# Patient Record
Sex: Female | Born: 1980 | Race: Black or African American | Hispanic: No | Marital: Single | Smoking: Never smoker
Health system: Southern US, Community
[De-identification: ages and names within clinical notes are randomized; demographics above are authoritative.]

## PROBLEM LIST (undated history)

## (undated) ENCOUNTER — Inpatient Hospital Stay (HOSPITAL_COMMUNITY): Payer: Self-pay

## (undated) ENCOUNTER — Ambulatory Visit: Payer: Managed Care, Other (non HMO) | Source: Home / Self Care

## (undated) DIAGNOSIS — N764 Abscess of vulva: Secondary | ICD-10-CM

---
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
## 2004-09-01 ENCOUNTER — Emergency Department (HOSPITAL_COMMUNITY): Admission: EM | Admit: 2004-09-01 | Discharge: 2004-09-01 | Payer: Self-pay | Admitting: Family Medicine

## 2005-03-31 ENCOUNTER — Emergency Department (HOSPITAL_COMMUNITY): Admission: EM | Admit: 2005-03-31 | Discharge: 2005-03-31 | Payer: Self-pay | Admitting: Emergency Medicine

## 2005-06-08 ENCOUNTER — Ambulatory Visit (HOSPITAL_COMMUNITY): Admission: RE | Admit: 2005-06-08 | Discharge: 2005-06-08 | Payer: Self-pay | Admitting: Family Medicine

## 2005-08-07 ENCOUNTER — Emergency Department (HOSPITAL_COMMUNITY): Admission: EM | Admit: 2005-08-07 | Discharge: 2005-08-07 | Payer: Self-pay | Admitting: Family Medicine

## 2005-09-08 ENCOUNTER — Ambulatory Visit (HOSPITAL_COMMUNITY): Admission: AD | Admit: 2005-09-08 | Discharge: 2005-09-08 | Payer: Self-pay | Admitting: Gynecology

## 2006-01-04 ENCOUNTER — Other Ambulatory Visit: Admission: RE | Admit: 2006-01-04 | Discharge: 2006-01-04 | Payer: Self-pay | Admitting: Obstetrics and Gynecology

## 2006-03-19 ENCOUNTER — Inpatient Hospital Stay (HOSPITAL_COMMUNITY): Admission: AD | Admit: 2006-03-19 | Discharge: 2006-03-22 | Payer: Self-pay | Admitting: Obstetrics and Gynecology

## 2006-04-30 ENCOUNTER — Emergency Department (HOSPITAL_COMMUNITY): Admission: EM | Admit: 2006-04-30 | Discharge: 2006-04-30 | Payer: Self-pay | Admitting: Family Medicine

## 2008-11-05 ENCOUNTER — Emergency Department (HOSPITAL_COMMUNITY): Admission: EM | Admit: 2008-11-05 | Discharge: 2008-11-05 | Payer: Self-pay | Admitting: Family Medicine

## 2009-02-25 ENCOUNTER — Emergency Department (HOSPITAL_COMMUNITY): Admission: EM | Admit: 2009-02-25 | Discharge: 2009-02-25 | Payer: Self-pay | Admitting: Emergency Medicine

## 2010-08-07 NOTE — H&P (Signed)
NAME:  Elizabeth Donaldson, Elizabeth Donaldson NO.:  [id number]   MEDICAL RECORD NO.:  [id number]          PATIENT TYPE:  OUT   LOCATION:  [facility]                          FACILITY:  [facility]   PHYSICIAN:  Hal Morales, M.D.DATE OF BIRTH:  1980-07-20   DATE OF ADMISSION:  03/20/2006  DATE OF DISCHARGE:                              HISTORY & PHYSICAL   HISTORY OF PRESENT ILLNESS:  This is a 30 year old gravida 1, para 0 at  57 and 3/7 weeks, who presents with contractions every 2 minutes. She  denies leaking or bleeding.  Reports positive fetal movement.  The  pregnancy has been followed by Dr. Pennie Rushing and remarkable for:  1. Increased BMI.  2. ASCUS Pap.  3. First trimester trichomonas.  4. History of smoking.  5. Group B strep positive.   ALLERGIES:  NONE.   OBSTETRICAL HISTORY:  The patient is primigravida.   MEDICAL HISTORY:  Remarkable for:  1. Trichomonas in June 2007, which was treated.  2. Childhood varicella.   SURGICAL HISTORY:  Negative.   FAMILY HISTORY:  Remarkable for mother, father and grandmother with  hypertension.  Mother and grandfather with anemia.  Grandfather, aunt  and uncle with diabetes.  Cousin with Grave's disease.  Uncle with renal  failure; and a grandfather with kidney stones.   GENETIC HISTORY:  Negative.   SOCIAL HISTORY:  The patient is single.  Father of the baby, Denver Faster, is  questionably supportive (not currently with her tonight).  Her mother is  with her tonight.  She is of the [employer].  She denies any  alcohol, tobacco or drug use.   PRENATAL LABS:  Hemoglobin 13.5, platelets 442.  Blood type O positive.  Antibody screen negative.  RPR Nonreactive.  Rubella immune.  Hepatitis  negative.  HIV declined.  Pap test ASCUS.  Gonorrhea and chlamydia both  negative.  Hemoglobin electrophoresis normal. Varicella immune.   HISTORY OF CURRENT PREGNANCY:  The patient [facility]red[facility] She had been  transferred to [facility] at 16 weeks.  She had an ultrasound at 19 weeks, which  was normal.  She had a Glucola at 28 weeks, which was 80.  She had an  ultrasound at 34 weeks, showing 85% percentile growth and bilateral  hydroceles in the baby.  She was positive group B strep at term.  The patient developed a folliculitis in the right upper thigh with an  abscess, which was recently drained and packed and is healing.   PHYSICAL EXAMINATION:  VITAL SIGNS:  Stable, afebrile.  HEENT:  Within normal limits.  NECK:  Thyroid normal, not enlarged.  ABDOMEN:  Gravid at 38 cm.  Vertex to Leopold's.  FETAL MONITORING:  Shows reactive fetal heart rate with uterine  contractions every 2 minutes.  PELVIC:  Cervix was initially 2-3 cm and is now 4-4+ cm; 85% effaced,  minus 2 station and vertex presentation.  EXTREMITIES:  Within normal limits.   ASSESSMENT:  1. Intrauterine pregnancy at 9 and 3/7 weeks.  2. Active labor.  3. Group B  strep positive.  4. Right thigh abscess.   PLAN:  1. Admit to birthing suite, per Dr. Pennie Rushing.  2. Routine M.D. orders.  3. Penicillin prophylaxis.      Marie L. Williams, C.N.M.      Hal Morales, M.D.  Electronically Signed    MLW/MEDQ  D:  03/20/2006  T:  03/20/2006  Job:  [id number]

## 2012-04-24 ENCOUNTER — Encounter (HOSPITAL_COMMUNITY): Payer: Self-pay

## 2012-04-24 ENCOUNTER — Emergency Department (HOSPITAL_COMMUNITY)
Admission: EM | Admit: 2012-04-24 | Discharge: 2012-04-24 | Disposition: A | Discharge status: Home / Self Care | Payer: BC Managed Care – PPO | Source: Home / Self Care

## 2012-04-24 DIAGNOSIS — B349 Viral infection, unspecified: Secondary | ICD-10-CM

## 2012-04-24 DIAGNOSIS — B9789 Other viral agents as the cause of diseases classified elsewhere: Secondary | ICD-10-CM

## 2012-04-24 NOTE — ED Provider Notes (Signed)
Medical screening examination/treatment/procedure(s) were performed by non-physician practitioner and as supervising physician I was immediately available for consultation/collaboration.  Leslee Home, M.D.   Reuben Likes, MD 04/24/12 2226

## 2012-04-24 NOTE — ED Provider Notes (Signed)
History     CSN: [id number]  Arrival date & time 04/24/12  1128   First MD Initiated Contact with Patient 04/24/12 1217      No chief complaint on file.   (Consider location/radiation/quality/duration/timing/severity/associated sxs/prior treatment) HPI Comments: 32 year old female who states she developed a feeling of cold alternating with hot yesterday. It is associated with mild body aches. She denies fever, chills, headache, upper respiratory congestion, earache, sore throat, cough, shortness of breath, chest pain, abdominal pain, nausea, vomiting or urinary symptoms. She denies any source of pain or bleeding other than bodyaches.   No past medical history on file.  No past surgical history on file.  No family history on file.  History  Substance Use Topics  . Smoking status: Not on file  . Smokeless tobacco: Not on file  . Alcohol Use: Not on file    OB History    No data available      Review of Systems  Constitutional: Positive for activity change. Negative for fever, chills, appetite change and fatigue.  HENT: Negative for congestion, sore throat, facial swelling, rhinorrhea, trouble swallowing, neck pain, neck stiffness, voice change and postnasal drip.   Eyes: Negative.   Respiratory: Negative.   Cardiovascular: Negative.   Gastrointestinal: Negative.   Genitourinary: Negative.   Musculoskeletal: Positive for myalgias.  Skin: Negative for pallor and rash.  Neurological: Negative.   Hematological: Negative.   Psychiatric/Behavioral: Negative.     Allergies  Review of patient's allergies indicates not on file.  Home Medications  No current outpatient prescriptions on file.  BP 125/80  Pulse 84  Temp 99.1 F (37.3 C) (Oral)  Resp 20  SpO2 98%  Physical Exam  Nursing note and vitals reviewed. Constitutional: She is oriented to person, place, and time. She appears well-developed and well-nourished. No distress.  HENT:  Nose: Nose normal.   Mouth/Throat: Oropharynx is clear and moist. No oropharyngeal exudate.       Bilateral TMs are normal.  Eyes: Conjunctivae normal and EOM are normal. Pupils are equal, round, and reactive to light.  Neck: Normal range of motion. Neck supple.  Cardiovascular: Normal rate, regular rhythm and normal heart sounds.   No murmur heard. Pulmonary/Chest: Effort normal and breath sounds normal. No respiratory distress. She has no wheezes. She has no rales.  Abdominal: Soft. There is no tenderness.  Musculoskeletal: Normal range of motion. She exhibits no edema.  Lymphadenopathy:    She has no cervical adenopathy.  Neurological: She is alert and oriented to person, place, and time.  Skin: Skin is warm and dry. No rash noted.  Psychiatric: She has a normal mood and affect.    ED Course  Procedures (including critical care time)  Labs Reviewed - No data to display No results found.   1. Nonspecific syndrome suggestive of viral illness       MDM  Rest for the next 2 or 3 days as needed. Drink plenty of fluids stay well hydrated For aches may take Tylenol every 4 hours or ibuprofen every 6 hours as needed. Instructions with red flags to watch for , Seek medical attention for these.  For any worsening new symptoms or problems may return.        Hayden Rasmussen, NP 04/24/12 [phone]

## 2012-04-24 NOTE — ED Notes (Signed)
Seen by NP only.

## 2012-07-16 ENCOUNTER — Emergency Department (INDEPENDENT_AMBULATORY_CARE_PROVIDER_SITE_OTHER): Payer: BC Managed Care – PPO

## 2012-07-16 ENCOUNTER — Encounter (HOSPITAL_COMMUNITY): Payer: Self-pay | Admitting: *Deleted

## 2012-07-16 ENCOUNTER — Emergency Department (HOSPITAL_COMMUNITY)
Admission: EM | Admit: 2012-07-16 | Discharge: 2012-07-16 | Disposition: A | Discharge status: Home / Self Care | Payer: BC Managed Care – PPO | Source: Home / Self Care | Attending: Emergency Medicine | Admitting: Emergency Medicine

## 2012-07-16 DIAGNOSIS — S93409A Sprain of unspecified ligament of unspecified ankle, initial encounter: Secondary | ICD-10-CM

## 2012-07-16 DIAGNOSIS — S93401A Sprain of unspecified ligament of right ankle, initial encounter: Secondary | ICD-10-CM

## 2012-07-16 MED ORDER — ACETAMINOPHEN 325 MG PO TABS: 650.0000 mg | ORAL_TABLET | Freq: Once | ORAL | Status: DC

## 2012-07-16 MED ORDER — HYDROCODONE-ACETAMINOPHEN 5-325 MG PO TABS
ORAL_TABLET | ORAL | Status: DC
Start: 2012-07-16 — End: 2012-08-20

## 2012-07-16 NOTE — ED Notes (Signed)
Walking in high heels last night.  Twisted R ankle and fell.  Hard bear wt. on it today.  Swelling medially and laterally.  R ppp.

## 2012-07-16 NOTE — ED Provider Notes (Signed)
Chief Complaint:   Chief Complaint  Patient presents with  . Fall  . Ankle Pain    History of Present Illness:   Elizabeth Donaldson is a 32 year old female who injured her right ankle while walking in high heels last night. She twisted her ankle, did not hear a pop, but noted pain over the medial malleolus and some swelling. At first she was able to walk on it, but now cannot bear weight well. There is some swelling, no bruising, and no numbness or tingling.  Review of Systems:  Other than noted above, the patient denies any of the following symptoms: Systemic:  No fevers, chills, sweats, or aches.   Musculoskeletal:  No joint pain, arthritis, bursitis, swelling, back pain, or neck pain. Neurological:  No muscular weakness or paresthesias.  PMFSH:  Past medical history, family history, social history, meds, and allergies were reviewed.   Physical Exam:   Vital signs:  BP 134/82  Pulse 76  Temp(Src) 99.1 F (37.3 C) (Oral)  Resp 16  SpO2 100%  LMP 06/20/2012 Gen:  Alert and oriented times 3.  In no distress. Musculoskeletal: Exam of the ankle reveals slight swelling beneath the medial malleolus and tenderness to palpation. There's not much tenderness or swelling below the lateral malleolus. The ankle has a limited range of motion with pain on movement. Anterior drawer sign was negative.  Talar tilt negative. Squeeze test negative. Achilles tendon, peroneal tendon, and tibialis posterior were intact. Otherwise, all joints had a full a ROM with no swelling, bruising or deformity.  No edema, pulses full. Extremities were warm and pink.  Capillary refill was brisk.  Skin:  Clear, warm and dry.  No rash. Neuro:  Alert and oriented times 3.  Muscle strength was normal.  Sensation was intact to light touch.   Radiology:  Dg Ankle Complete Right  07/16/2012  *RADIOLOGY REPORT*  Clinical Data: Fall  RIGHT ANKLE - COMPLETE 3+ VIEW  Comparison: All  Findings: Normal alignment and no fracture.  Ankle  joint effusion is present.  Ankle mortise is intact.  IMPRESSION: Ankle effusion.  Negative for fracture.   Original Report Authenticated By: Janeece Riggers, M.D.    I reviewed the images independently and personally and concur with the radiologist's findings.  Course in Urgent Care Center:   She was given an ASO brace and crutches for ambulation. Also for the pain she was given ibuprofen since she is going to be driving home.  Assessment:  The encounter diagnosis was Ankle sprain, right, initial encounter.  Plan:   1.  The following meds were prescribed:   Discharge Medication List as of 07/16/2012  4:06 PM    START taking these medications   Details  HYDROcodone-acetaminophen (NORCO/VICODIN) 5-325 MG per tablet 1 to 2 tabs every 4 to 6 hours as needed for pain., Print       2.  The patient was instructed in symptomatic care, including rest and activity, elevation, application of ice and compression.  Appropriate handouts were given. 3.  The patient was told to return if becoming worse in any way, if no better in 3 or 4 days, and given some red flag symptoms such as worsening pain or neurological symptoms that would indicate earlier return.   4.  The patient was told to follow up with Dr. Ranell Patrick if no better in 2 weeks.    Reuben Likes, MD 07/16/12 [phone]

## 2012-08-12 ENCOUNTER — Emergency Department (HOSPITAL_COMMUNITY)
Admission: EM | Admit: 2012-08-12 | Discharge: 2012-08-12 | Disposition: A | Discharge status: Home / Self Care | Payer: BC Managed Care – PPO | Source: Home / Self Care | Attending: Emergency Medicine | Admitting: Emergency Medicine

## 2012-08-12 ENCOUNTER — Other Ambulatory Visit (HOSPITAL_COMMUNITY)
Admission: RE | Admit: 2012-08-12 | Discharge: 2012-08-12 | Disposition: A | Discharge status: Home / Self Care | Payer: BC Managed Care – PPO | Source: Ambulatory Visit | Attending: Emergency Medicine | Admitting: Emergency Medicine

## 2012-08-12 ENCOUNTER — Encounter (HOSPITAL_COMMUNITY): Payer: Self-pay | Admitting: Emergency Medicine

## 2012-08-12 DIAGNOSIS — S93409A Sprain of unspecified ligament of unspecified ankle, initial encounter: Secondary | ICD-10-CM

## 2012-08-12 DIAGNOSIS — Z3201 Encounter for pregnancy test, result positive: Secondary | ICD-10-CM

## 2012-08-12 DIAGNOSIS — Z3481 Encounter for supervision of other normal pregnancy, first trimester: Secondary | ICD-10-CM

## 2012-08-12 DIAGNOSIS — Z113 Encounter for screening for infections with a predominantly sexual mode of transmission: Secondary | ICD-10-CM | POA: Insufficient documentation

## 2012-08-12 DIAGNOSIS — N760 Acute vaginitis: Secondary | ICD-10-CM | POA: Insufficient documentation

## 2012-08-12 LAB — POCT PREGNANCY, URINE: Preg Test, Ur: POSITIVE — AB

## 2012-08-12 MED ORDER — PRENATAL VITAMINS (DIS) PO TABS: ORAL_TABLET | ORAL | Status: DC

## 2012-08-12 NOTE — ED Notes (Signed)
Pt c/o spotting episode on Sunday notice with wiping. Pt state positive pregnancy test today.  Pt denies pelvic or abdominal pain. Was pregnancy confirmation.

## 2012-08-12 NOTE — ED Provider Notes (Signed)
Chief Complaint:   Chief Complaint  Patient presents with  . Possible Pregnancy    positive home preg. test today. one spotting episode last sunday. denies pelvic or abdomial pain    History of Present Illness:   Elizabeth Donaldson is a 32 year old female who comes in today for confirmation of the diagnosis of pregnancy. Her last normal menstrual period was about April 27. She had a tiny spot of blood that she passed this past Sunday, week ago. This was not accompanied by pain or cramping. She's had no further bleeding, spotting, pain, or cramping since then. She took a pregnancy test today and it was positive. She is a gravida 2 para 1 aborta 0. Her child is 35 years old. She had no problems with her previous pregnancy. She denies any history of diabetes or high blood pressure. She's a little bit nauseated but not having vomiting. No breast swelling or tenderness. She's otherwise in good health and does not smoke or drink.  Review of Systems:  Other than noted above, the patient denies any of the following symptoms: Systemic:  No fever, chills, sweats, or weight loss. GI:  No abdominal pain, nausea, anorexia, vomiting, diarrhea, constipation, melena or hematochezia. GU:  No dysuria, frequency, urgency, hematuria, vaginal discharge, itching, or abnormal vaginal bleeding. Skin:  No rash or itching.  PMFSH:  Past medical history, family history, social history, meds, and allergies were reviewed.    Physical Exam:   Vital signs:  BP 138/72  Pulse 87  Temp(Src) 98.2 F (36.8 C) (Oral)  Resp 16  SpO2 100%  LMP 07/16/2012 General:  Alert, oriented and in no distress. Lungs:  Breath sounds clear and equal bilaterally.  No wheezes, rales or rhonchi. Heart:  Regular rhythm.  No gallops or murmers. Abdomen:  Soft, flat and non-distended.  No organomegaly or mass.  No tenderness, guarding or rebound.  Bowel sounds normally active. Pelvic exam:  Normal external genitalia, vaginal and cervical mucosa  were normal. There was a small amount of white discharge. There was no bleeding or spotting. No pain on cervical motion. Uterus was normal in size and shape and nontender. No adnexal masses or tenderness. Skin:  Clear, warm and dry.  Labs:   Results for orders placed during the hospital encounter of 08/12/12  POCT PREGNANCY, URINE      Result Value Range   Preg Test, Ur POSITIVE (*) NEGATIVE    DNA probe for gonorrhea, Chlamydia, Gardnerella, Trichomonas, and candida obtained as well as a quantitative hCG.  Assessment:  The encounter diagnosis was Pregnancy, subsequent, first trimester.  There is no evidence for tubal ectopic pregnancy. I think the small amount of blood that she saw may of just been due to implantation. I told her she does see any more bleeding or have any pain to go directly to [facility] hospital for an ultrasound. She is happy to be pregnant, and I gave her a prescription for prenatal vitamins. Since she did have some minor bleeding, this technically would be considered a threatened abortion, so I suggested she followup at [facility] next week. A baseline quantitative hCG was obtained, with the thought that they will want to obtain another one when she goes there.  Plan:   1.  The following meds were prescribed:   Discharge Medication List as of 08/12/2012  5:52 PM    START taking these medications   Details  Prenatal Vitamins (DIS) TABS 1 daily, Normal       2.  The patient was instructed in symptomatic care and handouts were given. 3.  The patient was told to return if becoming worse in any way, if no better in 3 or 4 days, and given some red flag symptoms such as bleeding or pain that would indicate earlier return. 4.  Follow up at [facility] next week.    Reuben Likes, MD 08/12/12 [phone]

## 2012-08-16 NOTE — ED Notes (Signed)
GC/Chlamydia neg., Affirm: Gardnerella pos., Candida and Trich neg., BHCG 15,051 H.  Pt. adequately treated with Flagyl. Fowarded to Dr. Lorenz Coaster.  Vassie Moselle 08/16/2012

## 2012-08-17 ENCOUNTER — Telehealth (HOSPITAL_COMMUNITY): Payer: Self-pay | Admitting: *Deleted

## 2012-08-17 ENCOUNTER — Telehealth (HOSPITAL_COMMUNITY): Payer: Self-pay | Admitting: Emergency Medicine

## 2012-08-17 MED ORDER — METRONIDAZOLE 500 MG PO TABS: 500.0000 mg | ORAL_TABLET | Freq: Two times a day (BID) | ORAL | Status: DC

## 2012-08-17 NOTE — ED Notes (Signed)
The patient's DNA probe came back positive for Gardnerella. This was not treated. We'll send in a prescription to her pharmacy for metronidazole since she is pregnant.  Reuben Likes, MD 08/17/12 [phone]

## 2012-08-17 NOTE — ED Notes (Signed)
I called pt. Pt. verified x 2 and given results.  Pt. told she needs Flagyl for bacterial vaginosis.   Pt. instructed to no alcohol while taking this medication.  Pt. told she can pick up Rx. At [facility] on [address]. Pt. voiced understanding.  Pt. has appt. With OB-GYN tomorrow. Instructed pt. To tell her doctor. Pt. voiced understanding. Vassie Moselle 08/17/2012

## 2012-08-17 NOTE — Telephone Encounter (Signed)
Message copied by Reuben Likes on Thu Aug 17, 2012  9:16 AM ------      Message from: Vassie Moselle      Created: Wed Aug 16, 2012  2:59 PM      Regarding: labs       Documented.      ----- Message -----         From: Lab In [facility] Interface         Sent: 08/12/2012   6:45 PM           To: [facility] Follow Up                   ------

## 2012-08-20 ENCOUNTER — Encounter (HOSPITAL_COMMUNITY): Payer: Self-pay | Admitting: *Deleted

## 2012-08-20 ENCOUNTER — Inpatient Hospital Stay (HOSPITAL_COMMUNITY)
Admission: AD | Admit: 2012-08-20 | Discharge: 2012-08-20 | Disposition: A | Discharge status: Home / Self Care | Payer: BC Managed Care – PPO | Source: Ambulatory Visit | Attending: Obstetrics and Gynecology | Admitting: Obstetrics and Gynecology

## 2012-08-20 ENCOUNTER — Inpatient Hospital Stay (HOSPITAL_COMMUNITY): Payer: BC Managed Care – PPO

## 2012-08-20 DIAGNOSIS — O26859 Spotting complicating pregnancy, unspecified trimester: Secondary | ICD-10-CM

## 2012-08-20 LAB — CBC
HCT: 38.5 % (ref 36.0–46.0)
Hemoglobin: 13.1 g/dL (ref 12.0–15.0)
MCH: 29.6 pg (ref 26.0–34.0)
MCV: 86.9 fL (ref 78.0–100.0)
RBC: 4.43 MIL/uL (ref 3.87–5.11)
WBC: 9.2 10*3/uL (ref 4.0–10.5)

## 2012-08-20 LAB — HCG, QUANTITATIVE, PREGNANCY: hCG, Beta Chain, Quant, S: 53440 m[IU]/mL — ABNORMAL HIGH (ref <5)

## 2012-08-20 NOTE — MAU Provider Note (Signed)
History     CSN: [id number]  Arrival date and time: 08/20/12 1507   First Provider Initiated Contact with Patient 08/20/12 1605      Chief Complaint  Patient presents with  . Vaginal Bleeding   HPI 32 y.o. G2P1001 at [redacted]w[redacted]d with spotting x 2 today, a few episodes of spotting prior to this. No pain. Recently treated for BV.   History reviewed. No pertinent past medical history.  History reviewed. No pertinent past surgical history.  Family History  Problem Relation Age of Onset  . Hypertension Mother   . Hypertension Father     History  Substance Use Topics  . Smoking status: Never Smoker   . Smokeless tobacco: Not on file  . Alcohol Use: No    Allergies: No Known Allergies  Prescriptions prior to admission  Medication Sig Dispense Refill  . HYDROcodone-acetaminophen (NORCO/VICODIN) 5-325 MG per tablet 1 to 2 tabs every 4 to 6 hours as needed for pain.  20 tablet  0  . ibuprofen (ADVIL,MOTRIN) 200 MG tablet Take 200 mg by mouth every 6 (six) hours as needed for pain.      . metroNIDAZOLE (FLAGYL) 500 MG tablet Take 1 tablet (500 mg total) by mouth 2 (two) times daily.  14 tablet  0  . Prenatal Vitamins (DIS) TABS 1 daily  30 tablet  12    Review of Systems  Constitutional: Negative.   Respiratory: Negative.   Cardiovascular: Negative.   Gastrointestinal: Negative for nausea, vomiting, abdominal pain, diarrhea and constipation.  Genitourinary: Negative for dysuria, urgency, frequency, hematuria and flank pain.       + spotting   Musculoskeletal: Negative.   Neurological: Negative.   Psychiatric/Behavioral: Negative.    Physical Exam   Blood pressure 133/61, pulse 95, temperature 97.4 F (36.3 C), temperature source Oral, resp. rate 18, height 5' 0.5" (1.537 m), weight 202 lb (91.627 kg), last menstrual period 07/16/2012.  Physical Exam  Nursing note and vitals reviewed. Constitutional: She is oriented to person, place, and time. She appears well-developed and  well-nourished. No distress.  Cardiovascular: Normal rate.   Respiratory: Effort normal.  GI: Soft. There is no tenderness.  Musculoskeletal: Normal range of motion.  Neurological: She is alert and oriented to person, place, and time.  Skin: Skin is warm and dry.  Psychiatric: She has a normal mood and affect.    MAU Course  Procedures Results for orders placed during the hospital encounter of 08/20/12 (from the past 72 hour(s))  HCG, QUANTITATIVE, PREGNANCY     Status: Abnormal   Collection Time    08/20/12  3:34 PM      Result Value Range   hCG, Beta Chain, Quant, S 53440 (*) <5 mIU/mL   Comment:              GEST. AGE      CONC.  (mIU/mL)       <=1 WEEK        5 - 50         2 WEEKS       50 - 500         3 WEEKS       100 - 10,000         4 WEEKS     1,000 - 30,000         5 WEEKS     3,500 - 115,000       6-8 WEEKS     12,000 - 270,000  12 WEEKS     15,000 - 220,000                FEMALE AND NON-PREGNANT FEMALE:         LESS THAN 5 mIU/mL  CBC     Status: Abnormal   Collection Time    08/20/12  3:34 PM      Result Value Range   WBC 9.2  4.0 - 10.5 K/uL   RBC 4.43  3.87 - 5.11 MIL/uL   Hemoglobin 13.1  12.0 - 15.0 g/dL   HCT 41.3  24.4 - 01.0 %   MCV 86.9  78.0 - 100.0 fL   MCH 29.6  26.0 - 34.0 pg   MCHC 34.0  30.0 - 36.0 g/dL   RDW 27.2  53.6 - 64.4 %   Platelets 468 (*) 150 - 400 K/uL  ABO/RH     Status: None   Collection Time    08/20/12  3:34 PM      Result Value Range   ABO/RH(D) O POS     U/S: 7.4 week IUP, + FHR, small SCH  Assessment and Plan   1. Spotting in early pregnancy   Precautions rev'd, keep next appointment as scheduled    Medication List    TAKE these medications       metroNIDAZOLE 500 MG tablet  Commonly known as:  FLAGYL  Take 1 tablet (500 mg total) by mouth 2 (two) times daily.     Prenatal Vitamins (DIS) Tabs  1 daily            Follow-up Information   Follow up with [facility]. (as  scheduled)    Contact information:   [address] [place] [address] [phone]        Georges Mouse 08/20/2012, 4:05 PM

## 2012-08-20 NOTE — MAU Note (Addendum)
preg confirmed at Urgent Care. Was at [facility] on Fri..  Saw a spot of blood earlier when went to bathroom today.  Threw up when brushing teeth.

## 2012-08-20 NOTE — MAU Note (Signed)
Pt reports vomiting today one time. Then she noticed some spotting twice. Pt still feels nauseated, but denies need for nausea meds at this time. Pt denies pain.

## 2012-09-05 ENCOUNTER — Encounter (HOSPITAL_COMMUNITY): Payer: Self-pay | Admitting: *Deleted

## 2012-09-05 ENCOUNTER — Inpatient Hospital Stay (HOSPITAL_COMMUNITY)
Admission: AD | Admit: 2012-09-05 | Discharge: 2012-09-05 | Disposition: A | Discharge status: Home / Self Care | Payer: BC Managed Care – PPO | Source: Ambulatory Visit | Attending: Family Medicine | Admitting: Family Medicine

## 2012-09-05 DIAGNOSIS — O99891 Other specified diseases and conditions complicating pregnancy: Secondary | ICD-10-CM | POA: Insufficient documentation

## 2012-09-05 DIAGNOSIS — R109 Unspecified abdominal pain: Secondary | ICD-10-CM | POA: Insufficient documentation

## 2012-09-05 DIAGNOSIS — O26891 Other specified pregnancy related conditions, first trimester: Secondary | ICD-10-CM

## 2012-09-05 DIAGNOSIS — O9989 Other specified diseases and conditions complicating pregnancy, childbirth and the puerperium: Secondary | ICD-10-CM

## 2012-09-05 DIAGNOSIS — M549 Dorsalgia, unspecified: Secondary | ICD-10-CM | POA: Insufficient documentation

## 2012-09-05 LAB — URINALYSIS, ROUTINE W REFLEX MICROSCOPIC
Bilirubin Urine: NEGATIVE
Specific Gravity, Urine: 1.025 (ref 1.005–1.030)
Urobilinogen, UA: 0.2 mg/dL (ref 0.0–1.0)

## 2012-09-05 MED ORDER — CYCLOBENZAPRINE HCL 10 MG PO TABS: 10.0000 mg | ORAL_TABLET | Freq: Three times a day (TID) | ORAL | Status: DC | PRN

## 2012-09-05 NOTE — MAU Note (Signed)
Patient states she started having abdominal pain yesterday. Denies bleeding or discharge. Has her first OB appointment with [facility] on 6-30.

## 2012-09-05 NOTE — MAU Provider Note (Signed)
History     CSN: [id number]  Arrival date and time: 09/05/12 1315   First Provider Initiated Contact with Patient 09/05/12 1401      Chief Complaint  Patient presents with  . Abdominal Pain   HPI Patient endorses 9/10 left flank pain while lying down and with certain activities since yesterday. She describes it as aching in character, non-radiating, and is relieved by rest or sitting still. She has not engaged in any strenuous or exacerbating activity that she recalls. The pain is currently 0/10. She endorses nausea but equates this with her pregnancy. She denies diarrhea, fever, chills, painful urination, frequency, or urgency, vaginal bleeding or discharge.  OB History   Grav Para Term Preterm Abortions TAB SAB Ect Mult Living   2 1 1       1       History reviewed. No pertinent past medical history.  History reviewed. No pertinent past surgical history.  Family History  Problem Relation Age of Onset  . Hypertension Mother   . Hypertension Father     History  Substance Use Topics  . Smoking status: Never Smoker   . Smokeless tobacco: Not on file  . Alcohol Use: No    Allergies: No Known Allergies  Prescriptions prior to admission  Medication Sig Dispense Refill  . ondansetron (ZOFRAN) 8 MG tablet Take 8 mg by mouth every 8 (eight) hours as needed for nausea.      . Prenatal Vit-Fe Fumarate-FA (PRENATAL MULTIVITAMIN) TABS Take 1 tablet by mouth daily at 12 noon.        Review of Systems  Constitutional: Negative for fever, chills and weight loss.  Respiratory: Negative for cough.   Cardiovascular: Negative for chest pain and palpitations.  Gastrointestinal: Positive for nausea. Negative for vomiting, abdominal pain, diarrhea, constipation and blood in stool.  Genitourinary: Positive for flank pain (left lower flank). Negative for dysuria, urgency, frequency and hematuria.  Musculoskeletal: Negative for myalgias.  Neurological: Negative for dizziness and  headaches.   Physical Exam   Blood pressure 129/59, pulse 96, temperature 97 F (36.1 C), resp. rate 18, height 5' 0.5" (1.537 m), weight 93.078 kg (205 lb 3.2 oz), last menstrual period 07/16/2012, SpO2 100.00%.  Physical Exam  Constitutional: She is oriented to person, place, and time. She appears well-developed and well-nourished.  HENT:  Head: Normocephalic and atraumatic.  Neck: Normal range of motion.  Cardiovascular: Normal rate, regular rhythm, normal heart sounds and intact distal pulses.  Exam reveals no gallop and no friction rub.   No murmur heard. Respiratory: Effort normal and breath sounds normal.  GI: Soft. Bowel sounds are normal. She exhibits no distension and no mass (No hepatosplenomegaly). There is no tenderness. There is no rebound and no guarding.  Musculoskeletal: Normal range of motion.  Neurological: She is alert and oriented to person, place, and time.  Skin: Skin is warm and dry.  Psychiatric: She has a normal mood and affect. Her behavior is normal.    MAU Course  Procedures None   Assessment and Plan  A: 1. Back pain in pregnancy, first trimester   Appears MSK in origin  P: 1.    Medication List    TAKE these medications       cyclobenzaprine 10 MG tablet  Commonly known as:  FLEXERIL  Take 1 tablet (10 mg total) by mouth 3 (three) times daily as needed for muscle spasms.     ondansetron 8 MG tablet  Commonly known as:  ZOFRAN  Take 8 mg by mouth every 8 (eight) hours as needed for nausea.     prenatal multivitamin Tabs  Take 1 tablet by mouth daily at 12 noon.       Follow-up Information   Follow up with [facility]. (as scheduled)    Contact information:   [address] [place] [address] [phone]       Arther Abbott 09/05/2012, 2:08 PM

## 2012-09-05 NOTE — Progress Notes (Signed)
Written and verbal d/c instructions given and understanding voiced. 

## 2012-09-05 NOTE — MAU Note (Signed)
I've been having pain in my L side since yesterday. Worse with certain movements. Bothered me trying to sleep last night. Cramping and intermittent but esp noticeable with movement

## 2012-09-07 NOTE — MAU Provider Note (Signed)
Chart reviewed and agree with management and plan.  

## 2012-09-10 ENCOUNTER — Encounter (HOSPITAL_COMMUNITY): Payer: Self-pay | Admitting: Family

## 2012-09-10 ENCOUNTER — Inpatient Hospital Stay (HOSPITAL_COMMUNITY)
Admission: AD | Admit: 2012-09-10 | Discharge: 2012-09-10 | Disposition: A | Discharge status: Home / Self Care | Payer: BC Managed Care – PPO | Source: Ambulatory Visit | Attending: Obstetrics and Gynecology | Admitting: Obstetrics and Gynecology

## 2012-09-10 DIAGNOSIS — O2390 Unspecified genitourinary tract infection in pregnancy, unspecified trimester: Secondary | ICD-10-CM | POA: Insufficient documentation

## 2012-09-10 DIAGNOSIS — N751 Abscess of Bartholin's gland: Secondary | ICD-10-CM

## 2012-09-10 DIAGNOSIS — N764 Abscess of vulva: Secondary | ICD-10-CM | POA: Insufficient documentation

## 2012-09-10 MED ORDER — CEPHALEXIN 500 MG PO CAPS: 500.0000 mg | ORAL_CAPSULE | Freq: Four times a day (QID) | ORAL | Status: DC

## 2012-09-10 NOTE — MAU Note (Signed)
Patient presents to MAU with c/o boil or cyst on vagina since Friday. Denies drainage, fever, chills, or n/v. Reports she had bartholin cyst in last pregnancy.

## 2012-09-10 NOTE — MAU Note (Signed)
Pt c/o "boil" on her left labia. Noticed it on Friday. Had one with her last pregnancy.

## 2012-09-10 NOTE — MAU Provider Note (Signed)
  History     CSN: [id number]  Arrival date and time: 09/10/12 1129   First Provider Initiated Contact with Patient 09/10/12 1156      Chief Complaint  Patient presents with  . Cyst   HPI Ms. Elizabeth Donaldson is a 32 y.o. G2P1001 at [redacted]w[redacted]d who presents to MAU today with complaint of labial abscess. The patient states that the swelling started on Friday. She feels that it has increased slightly in size since then. It is tender to touch. She denies drainage, vaginal discharge, bleeding or fever. She has not tried hot compresses of sitz bath yet.   OB History   Grav Para Term Preterm Abortions TAB SAB Ect Mult Living   2 1 1       1       History reviewed. No pertinent past medical history.  History reviewed. No pertinent past surgical history.  Family History  Problem Relation Age of Onset  . Hypertension Mother   . Hypertension Father     History  Substance Use Topics  . Smoking status: Never Smoker   . Smokeless tobacco: Not on file  . Alcohol Use: No    Allergies: No Known Allergies  Prescriptions prior to admission  Medication Sig Dispense Refill  . cyclobenzaprine (FLEXERIL) 10 MG tablet Take 1 tablet (10 mg total) by mouth 3 (three) times daily as needed for muscle spasms.  30 tablet  0  . ondansetron (ZOFRAN) 8 MG tablet Take 8 mg by mouth every 8 (eight) hours as needed for nausea.      . Prenatal Vit-Fe Fumarate-FA (PRENATAL MULTIVITAMIN) TABS Take 1 tablet by mouth daily at 12 noon.        Review of Systems  Constitutional: Negative for fever.  Gastrointestinal: Negative for abdominal pain.  Genitourinary:       Neg - vaginal discharge, bleeding + labial swelling   Physical Exam   Blood pressure 136/65, pulse 101, temperature 98.7 F (37.1 C), temperature source Oral, resp. rate 18, height 5' 0.5" (1.537 m), weight 200 lb 9.6 oz (90.992 kg), last menstrual period 07/16/2012.  Physical Exam  Constitutional: She is oriented to person, place, and time.  She appears well-developed and well-nourished. No distress.  HENT:  Head: Normocephalic and atraumatic.  Cardiovascular: Normal rate, regular rhythm and normal heart sounds.   Respiratory: Effort normal and breath sounds normal. No respiratory distress.  GI: Soft. Bowel sounds are normal. She exhibits no distension and no mass. There is no tenderness. There is no rebound and no guarding.  Genitourinary: There is no tenderness on the right labia. There is tenderness (small amount of swelling noted at the base of the left labia without surrounding erythema; cool to touch) on the left labia.  Neurological: She is alert and oriented to person, place, and time.  Skin: Skin is warm and dry. No erythema.  Psychiatric: She has a normal mood and affect.    MAU Course  Procedures None  MDM Discussed patient with Dr. Ambrose Mantle. Prescribe Keflex and recommend sitz bath and warm compresses until drainage.   Assessment and Plan  A: Abscess  P: Discharge home Rx for Keflex sent to patient's pharmacy Patient advised to use warm compresses and sitz baths frequently until drainage Patient encouraged to keep follow-up in the office as scheduled Patient may return to MAU as needed or her condition were to change or worsen  Freddi Starr, PA-C  09/10/2012, 11:56 AM

## 2012-09-11 ENCOUNTER — Encounter (HOSPITAL_COMMUNITY): Payer: Self-pay | Admitting: *Deleted

## 2012-09-11 ENCOUNTER — Inpatient Hospital Stay (HOSPITAL_COMMUNITY)
Admission: AD | Admit: 2012-09-11 | Discharge: 2012-09-11 | Disposition: A | Discharge status: Home / Self Care | Payer: BC Managed Care – PPO | Source: Ambulatory Visit | Attending: Obstetrics and Gynecology | Admitting: Obstetrics and Gynecology

## 2012-09-11 DIAGNOSIS — O210 Mild hyperemesis gravidarum: Secondary | ICD-10-CM | POA: Insufficient documentation

## 2012-09-11 DIAGNOSIS — O219 Vomiting of pregnancy, unspecified: Secondary | ICD-10-CM

## 2012-09-11 DIAGNOSIS — O2390 Unspecified genitourinary tract infection in pregnancy, unspecified trimester: Secondary | ICD-10-CM | POA: Insufficient documentation

## 2012-09-11 DIAGNOSIS — N764 Abscess of vulva: Secondary | ICD-10-CM

## 2012-09-11 HISTORY — DX: Abscess of vulva: N76.4

## 2012-09-11 MED ORDER — OXYCODONE-ACETAMINOPHEN 5-325 MG PO TABS
1.0000 | ORAL_TABLET | Freq: Once | ORAL | Status: AC
  Administered 2012-09-11: 1 via ORAL
  Filled 2012-09-11: qty 1

## 2012-09-11 MED ORDER — LIDOCAINE 5 % EX OINT
TOPICAL_OINTMENT | Freq: Once | CUTANEOUS | Status: AC
  Administered 2012-09-11: 10:00:00 via TOPICAL
  Filled 2012-09-11: qty 35.44

## 2012-09-11 NOTE — MAU Provider Note (Signed)
History     CSN: [id number]  Arrival date and time: 09/11/12 7829   First Provider Initiated Contact with Patient 09/11/12 1004      No chief complaint on file.  HPI 32 y.o. G2P1001 at [redacted]w[redacted]d presents with labial pain and swelling. Seen in MAU yesterday with same c/o, was told abscess was not ready for I&D, instructed on warm compresses/sitz baths, rx Keflex. Has been unable to tolerate keflex d/t vomiting. States pain and swelling are worsening.   Past Medical History  Diagnosis Date  . Labial abscess     History reviewed. No pertinent past surgical history.  Family History  Problem Relation Age of Onset  . Hypertension Mother   . Hypertension Father     History  Substance Use Topics  . Smoking status: Never Smoker   . Smokeless tobacco: Not on file  . Alcohol Use: No    Allergies: No Known Allergies  Prescriptions prior to admission  Medication Sig Dispense Refill  . cephALEXin (KEFLEX) 500 MG capsule Take 1 capsule (500 mg total) by mouth 4 (four) times daily.  20 capsule  0  . ondansetron (ZOFRAN) 8 MG tablet Take 8 mg by mouth every 8 (eight) hours as needed for nausea.      . Prenatal Vit-Fe Fumarate-FA (PRENATAL MULTIVITAMIN) TABS Take 1 tablet by mouth daily at 12 noon.        ROS Physical Exam   Blood pressure 130/69, pulse 81, temperature 99 F (37.2 C), resp. rate 16, last menstrual period 07/16/2012.  Physical Exam  Nursing note and vitals reviewed. Constitutional: She is oriented to person, place, and time. She appears well-developed and well-nourished. No distress.  Cardiovascular: Normal rate.   Respiratory: Effort normal.  Genitourinary:     Musculoskeletal: Normal range of motion.  Neurological: She is alert and oriented to person, place, and time.    MAU Course  INCISION AND DRAINAGE Date/Time: 09/11/2012 4:39 PM Performed by: Archie Patten Authorized by: Archie Patten Consent: Verbal consent obtained. written consent  obtained. Risks and benefits: risks, benefits and alternatives were discussed Consent given by: patient Patient understanding: patient states understanding of the procedure being performed Patient consent: the patient's understanding of the procedure matches consent given Procedure consent: procedure consent matches procedure scheduled Relevant documents: relevant documents present and verified Required items: required blood products, implants, devices, and special equipment available Patient identity confirmed: verbally with patient and arm band Time out: Immediately prior to procedure a "time out" was called to verify the correct patient, procedure, equipment, support staff and site/side marked as required. Type: abscess Location: left labia. Local anesthetic: topical anesthetic Patient sedated: no Scalpel size: 15 Incision type: single straight Complexity: simple Drainage: purulent Drainage amount: moderate Wound treatment: wound left open Packing material: none Patient tolerance: Patient tolerated the procedure well with no immediate complications.     Assessment and Plan   1. Furuncle of vulva   2. Nausea and vomiting in pregnancy prior to [redacted] weeks gestation   I&D performed without difficulty, Continue abx as prescribed with antiemetics as needed, continue warm compresses/sitz baths, f/u if sx worsen    Medication List    TAKE these medications       cephALEXin 500 MG capsule  Commonly known as:  KEFLEX  Take 1 capsule (500 mg total) by mouth 4 (four) times daily.     ondansetron 8 MG tablet  Commonly known as:  ZOFRAN  Take 8 mg by mouth every 8 (eight)  hours as needed for nausea.     prenatal multivitamin Tabs  Take 1 tablet by mouth daily at 12 noon.            Follow-up Information   Follow up with [facility]. (as scheduled or sooner as needed)    Contact information:   [address] [place] [address] [phone]         Elizabeth Donaldson 09/11/2012, 10:09 AM

## 2012-09-11 NOTE — MAU Note (Signed)
Here Sunday for abscess on on left labia. Has appt with [facility] on June 30th, has had contact with office for initial visit

## 2012-09-24 ENCOUNTER — Inpatient Hospital Stay (HOSPITAL_COMMUNITY)
Admission: AD | Admit: 2012-09-24 | Discharge: 2012-09-24 | Disposition: A | Discharge status: Home / Self Care | Payer: BC Managed Care – PPO | Source: Ambulatory Visit | Attending: Obstetrics and Gynecology | Admitting: Obstetrics and Gynecology

## 2012-09-24 ENCOUNTER — Encounter (HOSPITAL_COMMUNITY): Payer: Self-pay | Admitting: Obstetrics and Gynecology

## 2012-09-24 DIAGNOSIS — O99891 Other specified diseases and conditions complicating pregnancy: Secondary | ICD-10-CM | POA: Insufficient documentation

## 2012-09-24 DIAGNOSIS — J309 Allergic rhinitis, unspecified: Secondary | ICD-10-CM | POA: Insufficient documentation

## 2012-09-24 NOTE — MAU Note (Signed)
Elizabeth Donaldson presents with a "head Cold". Symptoms started last night including sore throat, nasal congestion, watery eyes. She called her Dr. Isidore Moos and they told her to try over the counter throat lozenges. Pt says here throat is better but her cold symptoms are not any better.

## 2012-09-24 NOTE — MAU Provider Note (Signed)
  History     CSN: [id number]  Arrival date and time: 09/24/12 1622   First Provider Initiated Contact with Patient 09/24/12 1701      Chief Complaint  Patient presents with  . Nasal Congestion   HPI Elizabeth Donaldson is a 32 y.o. G2P1001 at [redacted]w[redacted]d. She presents with c/o head cold. She started her symptoms yesterday- sore throat, nasal congestion,watery eyes. She called the office, using Chloraseptic lozenges for sore throat with good results. She started headache with ears popping occ today. Denies any cough, fever or chills. No preg c/op today. Has appt 7/11 for PNV in the office.   OB History   Grav Para Term Preterm Abortions TAB SAB Ect Mult Living   2 1 1       1       Past Medical History  Diagnosis Date  . Labial abscess     History reviewed. No pertinent past surgical history.  Family History  Problem Relation Age of Onset  . Hypertension Mother   . Hypertension Father     History  Substance Use Topics  . Smoking status: Never Smoker   . Smokeless tobacco: Not on file  . Alcohol Use: No    Allergies: No Known Allergies  Prescriptions prior to admission  Medication Sig Dispense Refill  . ondansetron (ZOFRAN) 8 MG tablet Take 8 mg by mouth every 8 (eight) hours as needed for nausea.      . Prenatal Vit-Fe Fumarate-FA (PRENATAL MULTIVITAMIN) TABS Take 1 tablet by mouth daily at 12 noon.        Review of Systems  Constitutional: Negative for fever and chills.  HENT: Positive for congestion and sore throat.   Eyes:       Watery eyes  Gastrointestinal: Negative.   Genitourinary: Negative.   Neurological: Positive for headaches.   Physical Exam   Blood pressure 119/61, pulse 94, temperature 99.7 F (37.6 C), temperature source Oral, resp. rate 18, height 5\' 1"  (1.549 m), weight 203 lb (92.08 kg), last menstrual period 07/16/2012.  Physical Exam  Constitutional: She is oriented to person, place, and time. She appears well-developed and well-nourished.   HENT:  Head: Normocephalic.  Right Ear: External ear normal.  Left Ear: External ear normal.  Mouth/Throat: No oropharyngeal exudate.  Musculoskeletal: Normal range of motion.  Neurological: She is alert and oriented to person, place, and time.  Skin: Skin is warm and dry.  Psychiatric: She has a normal mood and affect. Her behavior is normal.    MAU Course  Procedures  MDM    Assessment and Plan  ASSESSMENT:  12+ wks Allergic rhinitis   PLAN:  OTC meds reviewed Call the office with increased S&S Keep appt 7/11 in the office Dr Ambrose Mantle aware   Tiburcio Pea, [place] Gully. 09/24/2012, 5:12 PM

## 2012-09-29 LAB — OB RESULTS CONSOLE RPR: RPR: NONREACTIVE

## 2012-09-29 LAB — OB RESULTS CONSOLE HIV ANTIBODY (ROUTINE TESTING): HIV: NONREACTIVE

## 2012-09-29 LAB — OB RESULTS CONSOLE GC/CHLAMYDIA
Chlamydia: NEGATIVE
GC PROBE AMP, GENITAL: NEGATIVE

## 2012-09-29 LAB — OB RESULTS CONSOLE HEPATITIS B SURFACE ANTIGEN: Hepatitis B Surface Ag: NEGATIVE

## 2012-09-29 LAB — OB RESULTS CONSOLE ABO/RH: RH TYPE: POSITIVE

## 2012-09-29 LAB — OB RESULTS CONSOLE RUBELLA ANTIBODY, IGM: Rubella: IMMUNE

## 2012-09-29 LAB — OB RESULTS CONSOLE ANTIBODY SCREEN: ANTIBODY SCREEN: NEGATIVE

## 2013-03-08 LAB — OB RESULTS CONSOLE GBS: STREP GROUP B AG: NEGATIVE

## 2013-03-22 NOTE — L&D Delivery Note (Signed)
Operative Delivery Note At 3:44 AM a viable female was delivered via Vaginal, Spontaneous Delivery.  Presentation: vertex; Position: Occiput,, Anterior; Station: +4.  Delivery of the head: 04/03/2013  3:43 AM First maneuver: 04/03/2013  3:43 AM, McRoberts Second maneuver: 04/03/2013  3:43 AM, McRoberts Suprapubic Pressure Third maneuver: ,   Fourth maneuver: ,   Fifth maneuver: ,   Sixth maneuver: ,    Verbal consent: obtained from patient.  APGAR: 8, 9; weight .   Placenta status: Intact, Spontaneous.   Cord: 3 vessels with the following complications: None.  Cord pH: collected  Anesthesia: Epidural  Episiotomy: None Lacerations: 1st degree;Vaginal Suture Repair: 3.0 vicryl Est. Blood Loss (mL): 350  Bleeding heavy, trailing membranes noted and removed.  Will continue to monitor bleeding.  Mom to postpartum.  Baby to Couplet care / Skin to Skin.  Routine PP orders Bottlefeeding  Vonita Calloway 04/03/2013, 4:36 AM

## 2013-04-02 ENCOUNTER — Encounter (HOSPITAL_COMMUNITY): Payer: Self-pay

## 2013-04-02 ENCOUNTER — Encounter (HOSPITAL_COMMUNITY): Payer: Medicaid Other | Admitting: Anesthesiology

## 2013-04-02 ENCOUNTER — Inpatient Hospital Stay (HOSPITAL_COMMUNITY)
Admission: AD | Admit: 2013-04-02 | Discharge: 2013-04-05 | DRG: 767 | Disposition: A | Discharge status: Home / Self Care | Payer: Medicaid Other | Source: Ambulatory Visit | Attending: Obstetrics and Gynecology | Admitting: Obstetrics and Gynecology

## 2013-04-02 ENCOUNTER — Inpatient Hospital Stay (HOSPITAL_COMMUNITY): Payer: Medicaid Other | Admitting: Anesthesiology

## 2013-04-02 DIAGNOSIS — O0940 Supervision of pregnancy with grand multiparity, unspecified trimester: Secondary | ICD-10-CM

## 2013-04-02 DIAGNOSIS — Z302 Encounter for sterilization: Secondary | ICD-10-CM

## 2013-04-02 DIAGNOSIS — O4290 Premature rupture of membranes, unspecified as to length of time between rupture and onset of labor, unspecified weeks of gestation: Principal | ICD-10-CM | POA: Diagnosis present

## 2013-04-02 DIAGNOSIS — O99214 Obesity complicating childbirth: Secondary | ICD-10-CM

## 2013-04-02 DIAGNOSIS — O358XX0 Maternal care for other (suspected) fetal abnormality and damage, not applicable or unspecified: Secondary | ICD-10-CM | POA: Diagnosis present

## 2013-04-02 DIAGNOSIS — Z8759 Personal history of other complications of pregnancy, childbirth and the puerperium: Secondary | ICD-10-CM | POA: Insufficient documentation

## 2013-04-02 DIAGNOSIS — O359XX0 Maternal care for (suspected) fetal abnormality and damage, unspecified, not applicable or unspecified: Secondary | ICD-10-CM | POA: Insufficient documentation

## 2013-04-02 DIAGNOSIS — E669 Obesity, unspecified: Secondary | ICD-10-CM | POA: Insufficient documentation

## 2013-04-02 LAB — CBC
HEMATOCRIT: 38.4 % (ref 36.0–46.0)
HEMOGLOBIN: 13.3 g/dL (ref 12.0–15.0)
MCH: 30.9 pg (ref 26.0–34.0)
MCHC: 34.6 g/dL (ref 30.0–36.0)
MCV: 89.1 fL (ref 78.0–100.0)
Platelets: 332 10*3/uL (ref 150–400)
RBC: 4.31 MIL/uL (ref 3.87–5.11)
RDW: 12.8 % (ref 11.5–15.5)
WBC: 9.3 10*3/uL (ref 4.0–10.5)

## 2013-04-02 LAB — TYPE AND SCREEN
ABO/RH(D): O POS
ANTIBODY SCREEN: NEGATIVE

## 2013-04-02 LAB — AMNISURE RUPTURE OF MEMBRANE (ROM) NOT AT ARMC: AMNISURE: POSITIVE

## 2013-04-02 MED ORDER — PHENYLEPHRINE 40 MCG/ML (10ML) SYRINGE FOR IV PUSH (FOR BLOOD PRESSURE SUPPORT)
80.0000 ug | PREFILLED_SYRINGE | INTRAVENOUS | Status: DC | PRN
  Filled 2013-04-02: qty 2
  Filled 2013-04-02: qty 10

## 2013-04-02 MED ORDER — OXYTOCIN 40 UNITS IN LACTATED RINGERS INFUSION - SIMPLE MED
1.0000 m[IU]/min | INTRAVENOUS | Status: DC
Start: 2013-04-02 — End: 2013-04-03
  Administered 2013-04-02: 1 m[IU]/min via INTRAVENOUS
  Filled 2013-04-02: qty 1000

## 2013-04-02 MED ORDER — CITRIC ACID-SODIUM CITRATE 334-500 MG/5ML PO SOLN: 30.0000 mL | ORAL | Status: DC | PRN

## 2013-04-02 MED ORDER — EPHEDRINE 5 MG/ML INJ
10.0000 mg | INTRAVENOUS | Status: DC | PRN
  Filled 2013-04-02: qty 2
  Filled 2013-04-02: qty 4

## 2013-04-02 MED ORDER — LACTATED RINGERS IV SOLN
INTRAVENOUS | Status: DC
  Administered 2013-04-02 (×2): via INTRAVENOUS

## 2013-04-02 MED ORDER — FENTANYL 2.5 MCG/ML BUPIVACAINE 1/10 % EPIDURAL INFUSION (WH - ANES)
14.0000 mL/h | INTRAMUSCULAR | Status: DC | PRN
  Administered 2013-04-02: 14 mL/h via EPIDURAL
  Filled 2013-04-02: qty 125

## 2013-04-02 MED ORDER — ONDANSETRON HCL 4 MG/2ML IJ SOLN: 4.0000 mg | Freq: Four times a day (QID) | INTRAMUSCULAR | Status: DC | PRN

## 2013-04-02 MED ORDER — LACTATED RINGERS IV SOLN: 500.0000 mL | INTRAVENOUS | Status: DC | PRN

## 2013-04-02 MED ORDER — LIDOCAINE HCL (PF) 1 % IJ SOLN
INTRAMUSCULAR | Status: DC | PRN
  Administered 2013-04-02 (×4): 4 mL

## 2013-04-02 MED ORDER — LIDOCAINE HCL (PF) 1 % IJ SOLN
30.0000 mL | INTRAMUSCULAR | Status: DC | PRN
  Filled 2013-04-02 (×2): qty 30

## 2013-04-02 MED ORDER — IBUPROFEN 600 MG PO TABS: 600.0000 mg | ORAL_TABLET | Freq: Four times a day (QID) | ORAL | Status: DC | PRN

## 2013-04-02 MED ORDER — OXYCODONE-ACETAMINOPHEN 5-325 MG PO TABS: 1.0000 | ORAL_TABLET | ORAL | Status: DC | PRN

## 2013-04-02 MED ORDER — ACETAMINOPHEN 325 MG PO TABS: 650.0000 mg | ORAL_TABLET | ORAL | Status: DC | PRN

## 2013-04-02 MED ORDER — OXYTOCIN 40 UNITS IN LACTATED RINGERS INFUSION - SIMPLE MED: 62.5000 mL/h | INTRAVENOUS | Status: DC

## 2013-04-02 MED ORDER — OXYTOCIN BOLUS FROM INFUSION: 500.0000 mL | INTRAVENOUS | Status: DC

## 2013-04-02 MED ORDER — TERBUTALINE SULFATE 1 MG/ML IJ SOLN: 0.2500 mg | Freq: Once | INTRAMUSCULAR | Status: AC | PRN

## 2013-04-02 MED ORDER — PHENYLEPHRINE 40 MCG/ML (10ML) SYRINGE FOR IV PUSH (FOR BLOOD PRESSURE SUPPORT)
80.0000 ug | PREFILLED_SYRINGE | INTRAVENOUS | Status: DC | PRN
  Filled 2013-04-02: qty 2

## 2013-04-02 MED ORDER — EPHEDRINE 5 MG/ML INJ
10.0000 mg | INTRAVENOUS | Status: DC | PRN
  Filled 2013-04-02: qty 2

## 2013-04-02 MED ORDER — LACTATED RINGERS IV SOLN
500.0000 mL | Freq: Once | INTRAVENOUS | Status: AC
  Administered 2013-04-02: 500 mL via INTRAVENOUS

## 2013-04-02 MED ORDER — DIPHENHYDRAMINE HCL 50 MG/ML IJ SOLN: 12.5000 mg | INTRAMUSCULAR | Status: DC | PRN

## 2013-04-02 NOTE — MAU Note (Signed)
Pt presents complaining of possible rupture of membranes at 1800 this evening. Denies vaginal bleeding or discharge.

## 2013-04-02 NOTE — H&P (Signed)
Ace GinsQuandra L Lerman is a 33 y.o. female, G2P1001 at 5041w5d, presenting for leaking fluid at 1800 after shower.  Pt notes some mild UCs in her back.  Pt was in the office today and was 3 cm / 50 % / -3 and her membranes were swept.  Denies VB, recent fever, resp or GI c/o's, UTI or PIH s/s. GFM. Desires epidural.  Desires BTL - Consent signed on 01/23/13 but not in chart, will have to locate  Patient Active Problem List   Diagnosis Date Noted  . Obesity, unspecified 04/02/2013  . History of pregnancy induced hypertension 04/02/2013  . Known or suspected fetal anomaly, antepartum 04/02/2013  . PROM (premature rupture of membranes) 04/02/2013    History of present pregnancy: Patient entered care at 26 weeks.  [facility]   EDC of 04/02/13 was established by LMP.   Anatomy scan:  18 weeks, with normal findings and an posterior placenta.   Normal 4 CH heart with an EIF Lt vent.  Additional US evaluations:   7043w2d for growth S>D = EFW 20.6%ile, AFI normal, frank breech, posterior placenta, BPD is 5th%ile.     2364w1d for growth and small BPD = EFW 40.9%ile, AFI normal , vtx.   Significant prenatal events:  none   Last evaluation:  04/02/13 at 737w0d   3 cm / 50% / -3  OB History   Grav Para Term Preterm Abortions TAB SAB Ect Mult Living   2 1 1       1      Past Medical History  Diagnosis Date  . Labial abscess    History reviewed. No pertinent past surgical history. Family History: family history includes Hypertension in her father and mother. Social History:  reports that she has never smoked. She does not have any smokeless tobacco history on file. She reports that she does not drink alcohol or use illicit drugs.   Prenatal Transfer Tool  Maternal Diabetes: No Genetic Screening: Normal Maternal Ultrasounds/Referrals: Abnormal:  Findings:   Isolated EIF (echogenic intracardiac focus) Fetal Ultrasounds or other Referrals:  None Maternal Substance Abuse:   No Significant Maternal Medications:  None Significant Maternal Lab Results: Lab values include: Group B Strep negative    ROS: see HPI above, all other systems are negative   No Known Allergies     Blood pressure 133/71, pulse 111, temperature 98.1 F (36.7 C), temperature source Oral, resp. rate 18, last menstrual period 07/16/2012.  Chest clear Heart RRR without murmur Abd gravid, NT Ext: WNL  FHR: Reactive NST UCs:  Regular 2-3 min  Prenatal labs: ABO, Rh: --/--/O POS (06/01 1534) Antibody:  Neg Rubella:   Immune RPR:   Neg HBsAg:   Neg HIV:   Neg GBS:  Neg Sickle cell/Hgb electrophoresis:  Normal study Pap:  09/19/12 No endocervical cells GC:  Neg Chlamydia:  Neg Genetic screenings:  CF - neg; AFP - neg Glucola:  65 Other:  none    Assessment/Plan: IUP at 667w0d PROM with some mild UCs GBS neg Desires epidural  Admit to BS per c/w Dr. Stefano GaulStringer as attending MD Routine L&D orders Reassess UCs and labor progress in a couple of hours then potentially initiate Pitocin Epidural prn  Rowan BlaseXLEY, JENNIFERCNM, MSN 04/02/2013, 8:13 PM

## 2013-04-02 NOTE — Progress Notes (Signed)
Notified of positive amnisure result. Will be down to admit pt

## 2013-04-02 NOTE — Anesthesia Preprocedure Evaluation (Signed)
Anesthesia Evaluation  Patient identified by MRN, date of birth, ID band Patient awake    Reviewed: Allergy & Precautions, H&P , NPO status , Patient's Chart, lab work & pertinent test results, reviewed documented beta blocker date and time   History of Anesthesia Complications Negative for: history of anesthetic complications  Airway Mallampati: II TM Distance: >3 FB Neck ROM: full    Dental  (+) Teeth Intact   Pulmonary neg pulmonary ROS,  breath sounds clear to auscultation        Cardiovascular negative cardio ROS  Rhythm:regular Rate:Normal     Neuro/Psych negative neurological ROS  negative psych ROS   GI/Hepatic negative GI ROS, Neg liver ROS,   Endo/Other  Morbid obesity  Renal/GU negative Renal ROS     Musculoskeletal   Abdominal   Peds  Hematology negative hematology ROS (+)   Anesthesia Other Findings   Reproductive/Obstetrics (+) Pregnancy                           Anesthesia Physical Anesthesia Plan  ASA: III  Anesthesia Plan: Epidural   Post-op Pain Management:    Induction:   Airway Management Planned:   Additional Equipment:   Intra-op Plan:   Post-operative Plan:   Informed Consent: I have reviewed the patients History and Physical, chart, labs and discussed the procedure including the risks, benefits and alternatives for the proposed anesthesia with the patient or authorized representative who has indicated his/her understanding and acceptance.     Plan Discussed with:   Anesthesia Plan Comments:         Anesthesia Quick Evaluation

## 2013-04-02 NOTE — Anesthesia Procedure Notes (Signed)
Epidural Patient location during procedure: OB Start time: 04/02/2013 11:13 PM  Staffing Performed by: anesthesiologist   Preanesthetic Checklist Completed: patient identified, site marked, surgical consent, pre-op evaluation, timeout performed, IV checked, risks and benefits discussed and monitors and equipment checked  Epidural Patient position: sitting Prep: site prepped and draped and DuraPrep Patient monitoring: continuous pulse ox and blood pressure Approach: midline Injection technique: LOR air  Needle:  Needle type: Tuohy  Needle gauge: 17 G Needle length: 9 cm and 9 Needle insertion depth: 8 cm Catheter type: closed end flexible Catheter size: 19 Gauge Catheter at skin depth: 13 cm Test dose: negative  Assessment Events: blood not aspirated, injection not painful, no injection resistance, negative IV test and no paresthesia  Additional Notes Discussed risk of headache, infection, bleeding, nerve injury and failed or incomplete block.  Patient voices understanding and wishes to proceed.  Epidural placed easily on first pass.  No paresthesia.  Patient tolerated procedure well with no apparent complications.  Jasmine DecemberA. Moani Weipert, MDReason for block:procedure for pain

## 2013-04-02 NOTE — Progress Notes (Signed)
Notified of pt arrival in MAU for eval. [facility]

## 2013-04-02 NOTE — Progress Notes (Addendum)
  Subjective: Pt reports mild UCs in back.  UCs intensity and frequency has not changed since PROM.  Objective: BP 126/79  Pulse 108  Temp(Src) 98 F (36.7 C) (Oral)  Resp 18  Ht 5\' 1"  (1.549 m)  Wt 234 lb (106.142 kg)  BMI 44.24 kg/m2  LMP 07/16/2012      FHT:  Cat I UC:   regular, every 2-3.5 minutes  SVE:   Dilation: 3 Effacement (%): 70 Station: -2 Exam by:: J.Purl Claytor,CNM  Assessment / Plan:  PROM at 1800 with mild UCs and no cervical change; Pitocin initiated  Labor: PROM without cervical change  Preeclampsia: no signs or symptoms of toxicity  Fetal Wellbeing: Category I  Pain Control: Labor support without medications  I/D: GBS neg; SROM clear fluid at 1300; Afebrile Anticipated MOD: NSVD   Elizabeth Donaldson 04/02/2013, 9:33 PM

## 2013-04-03 ENCOUNTER — Inpatient Hospital Stay (HOSPITAL_COMMUNITY): Payer: Medicaid Other | Admitting: Anesthesiology

## 2013-04-03 ENCOUNTER — Encounter (HOSPITAL_COMMUNITY): Payer: Medicaid Other | Admitting: Anesthesiology

## 2013-04-03 ENCOUNTER — Encounter (HOSPITAL_COMMUNITY)
Admission: AD | Disposition: A | Discharge status: Home / Self Care | Payer: Self-pay | Source: Ambulatory Visit | Attending: Obstetrics and Gynecology

## 2013-04-03 ENCOUNTER — Encounter (HOSPITAL_COMMUNITY): Payer: Self-pay | Admitting: *Deleted

## 2013-04-03 HISTORY — PX: TUBAL LIGATION: SHX77

## 2013-04-03 LAB — SURGICAL PCR SCREEN
MRSA, PCR: INVALID — AB
Staphylococcus aureus: INVALID — AB

## 2013-04-03 LAB — RPR: RPR Ser Ql: NONREACTIVE

## 2013-04-03 SURGERY — LIGATION, FALLOPIAN TUBE, POSTPARTUM
Anesthesia: Epidural | Site: Abdomen | Laterality: Bilateral

## 2013-04-03 MED ORDER — ONDANSETRON HCL 4 MG/2ML IJ SOLN: 4.0000 mg | INTRAMUSCULAR | Status: DC | PRN

## 2013-04-03 MED ORDER — LACTATED RINGERS IV SOLN
INTRAVENOUS | Status: DC
Start: 2013-04-03 — End: 2013-04-05
  Administered 2013-04-03: 08:00:00 via INTRAVENOUS

## 2013-04-03 MED ORDER — PRENATAL MULTIVITAMIN CH
1.0000 | ORAL_TABLET | Freq: Every day | ORAL | Status: DC
  Filled 2013-04-03: qty 1

## 2013-04-03 MED ORDER — FERROUS SULFATE 325 (65 FE) MG PO TABS
325.0000 mg | ORAL_TABLET | Freq: Two times a day (BID) | ORAL | Status: DC
Start: 2013-04-03 — End: 2013-04-05
  Administered 2013-04-04 – 2013-04-05 (×3): 325 mg via ORAL
  Filled 2013-04-03 (×3): qty 1

## 2013-04-03 MED ORDER — SIMETHICONE 80 MG PO CHEW
80.0000 mg | CHEWABLE_TABLET | ORAL | Status: DC | PRN
Start: 2013-04-03 — End: 2013-04-05

## 2013-04-03 MED ORDER — TETANUS-DIPHTH-ACELL PERTUSSIS 5-2.5-18.5 LF-MCG/0.5 IM SUSP: 0.5000 mL | Freq: Once | INTRAMUSCULAR | Status: DC

## 2013-04-03 MED ORDER — KETOROLAC TROMETHAMINE 30 MG/ML IJ SOLN: 15.0000 mg | Freq: Once | INTRAMUSCULAR | Status: DC | PRN

## 2013-04-03 MED ORDER — LACTATED RINGERS IV SOLN
INTRAVENOUS | Status: DC
Start: 2013-04-03 — End: 2013-04-03

## 2013-04-03 MED ORDER — KETOROLAC TROMETHAMINE 30 MG/ML IJ SOLN
INTRAMUSCULAR | Status: DC | PRN
  Administered 2013-04-03: 30 mg via INTRAVENOUS

## 2013-04-03 MED ORDER — FAMOTIDINE 20 MG PO TABS
40.0000 mg | ORAL_TABLET | Freq: Once | ORAL | Status: AC
  Administered 2013-04-03: 40 mg via ORAL
  Filled 2013-04-03: qty 2

## 2013-04-03 MED ORDER — FENTANYL CITRATE 0.05 MG/ML IJ SOLN
INTRAMUSCULAR | Status: DC | PRN
  Administered 2013-04-03 (×2): 50 ug via INTRAVENOUS

## 2013-04-03 MED ORDER — DIPHENHYDRAMINE HCL 25 MG PO CAPS: 25.0000 mg | ORAL_CAPSULE | Freq: Four times a day (QID) | ORAL | Status: DC | PRN

## 2013-04-03 MED ORDER — MIDAZOLAM HCL 2 MG/2ML IJ SOLN
INTRAMUSCULAR | Status: AC
  Filled 2013-04-03: qty 2

## 2013-04-03 MED ORDER — IBUPROFEN 600 MG PO TABS: 600.0000 mg | ORAL_TABLET | Freq: Four times a day (QID) | ORAL | Status: DC

## 2013-04-03 MED ORDER — HYDROMORPHONE HCL PF 1 MG/ML IJ SOLN: 0.2500 mg | INTRAMUSCULAR | Status: DC | PRN

## 2013-04-03 MED ORDER — OXYCODONE-ACETAMINOPHEN 5-325 MG PO TABS
1.0000 | ORAL_TABLET | ORAL | Status: DC | PRN
  Administered 2013-04-04 (×2): 1 via ORAL
  Administered 2013-04-04: 2 via ORAL
  Filled 2013-04-03: qty 1
  Filled 2013-04-03 (×2): qty 2

## 2013-04-03 MED ORDER — WITCH HAZEL-GLYCERIN EX PADS: 1.0000 "application " | MEDICATED_PAD | CUTANEOUS | Status: DC | PRN

## 2013-04-03 MED ORDER — FENTANYL CITRATE 0.05 MG/ML IJ SOLN
INTRAMUSCULAR | Status: AC
  Administered 2013-04-03: 50 ug via INTRAVENOUS
  Filled 2013-04-03: qty 2

## 2013-04-03 MED ORDER — SENNOSIDES-DOCUSATE SODIUM 8.6-50 MG PO TABS: 2.0000 | ORAL_TABLET | ORAL | Status: DC

## 2013-04-03 MED ORDER — ACETAMINOPHEN 160 MG/5ML PO SOLN
325.0000 mg | ORAL | Status: DC | PRN
  Administered 2013-04-03: 650 mg via ORAL
  Filled 2013-04-03: qty 20.3

## 2013-04-03 MED ORDER — BUPIVACAINE-EPINEPHRINE 0.5% -1:200000 IJ SOLN
INTRAMUSCULAR | Status: DC | PRN
  Administered 2013-04-03: 10 mL

## 2013-04-03 MED ORDER — BENZOCAINE-MENTHOL 20-0.5 % EX AERO: 1.0000 "application " | INHALATION_SPRAY | CUTANEOUS | Status: DC | PRN

## 2013-04-03 MED ORDER — DIBUCAINE 1 % RE OINT: 1.0000 "application " | TOPICAL_OINTMENT | RECTAL | Status: DC | PRN

## 2013-04-03 MED ORDER — ONDANSETRON HCL 4 MG PO TABS: 4.0000 mg | ORAL_TABLET | ORAL | Status: DC | PRN

## 2013-04-03 MED ORDER — SODIUM BICARBONATE 8.4 % IV SOLN
INTRAVENOUS | Status: AC
  Filled 2013-04-03: qty 50

## 2013-04-03 MED ORDER — IBUPROFEN 100 MG/5ML PO SUSP
600.0000 mg | Freq: Four times a day (QID) | ORAL | Status: DC
  Administered 2013-04-03 – 2013-04-05 (×7): 600 mg via ORAL
  Filled 2013-04-03 (×10): qty 30

## 2013-04-03 MED ORDER — FENTANYL CITRATE 0.05 MG/ML IJ SOLN
INTRAMUSCULAR | Status: AC
  Filled 2013-04-03: qty 2

## 2013-04-03 MED ORDER — KETOROLAC TROMETHAMINE 30 MG/ML IJ SOLN
INTRAMUSCULAR | Status: AC
  Filled 2013-04-03: qty 1

## 2013-04-03 MED ORDER — MIDAZOLAM HCL 5 MG/5ML IJ SOLN
INTRAMUSCULAR | Status: DC | PRN
  Administered 2013-04-03: 2 mg via INTRAVENOUS

## 2013-04-03 MED ORDER — SODIUM BICARBONATE 8.4 % IV SOLN
INTRAVENOUS | Status: DC | PRN
  Administered 2013-04-03: 5 mL via EPIDURAL
  Administered 2013-04-03: 3 mL via EPIDURAL
  Administered 2013-04-03: 7 mL via EPIDURAL
  Administered 2013-04-03: 5 mL via EPIDURAL

## 2013-04-03 MED ORDER — ZOLPIDEM TARTRATE 5 MG PO TABS: 5.0000 mg | ORAL_TABLET | Freq: Every evening | ORAL | Status: DC | PRN

## 2013-04-03 MED ORDER — PROMETHAZINE HCL 25 MG/ML IJ SOLN: 6.2500 mg | INTRAMUSCULAR | Status: DC | PRN

## 2013-04-03 MED ORDER — LANOLIN HYDROUS EX OINT: TOPICAL_OINTMENT | CUTANEOUS | Status: DC | PRN

## 2013-04-03 MED ORDER — METOCLOPRAMIDE HCL 10 MG PO TABS
10.0000 mg | ORAL_TABLET | Freq: Once | ORAL | Status: AC
  Administered 2013-04-03: 10 mg via ORAL
  Filled 2013-04-03: qty 1

## 2013-04-03 MED ORDER — MEPERIDINE HCL 25 MG/ML IJ SOLN: 6.2500 mg | INTRAMUSCULAR | Status: DC | PRN

## 2013-04-03 MED ORDER — LIDOCAINE-EPINEPHRINE (PF) 2 %-1:200000 IJ SOLN
INTRAMUSCULAR | Status: AC
  Filled 2013-04-03: qty 20

## 2013-04-03 MED ORDER — OXYCODONE HCL 5 MG/5ML PO SOLN
5.0000 mg | ORAL | Status: DC | PRN
  Administered 2013-04-03: 5 mg via ORAL
  Filled 2013-04-03: qty 5

## 2013-04-03 MED ORDER — FENTANYL CITRATE 0.05 MG/ML IJ SOLN
25.0000 ug | INTRAMUSCULAR | Status: DC | PRN
  Administered 2013-04-03: 50 ug via INTRAVENOUS
  Administered 2013-04-03 (×2): 25 ug via INTRAVENOUS

## 2013-04-03 SURGICAL SUPPLY — 28 items
CHLORAPREP W/TINT 26ML (MISCELLANEOUS) ×3 IMPLANT
CLOSURE WOUND 1/2 X4 (GAUZE/BANDAGES/DRESSINGS)
CLOTH BEACON ORANGE TIMEOUT ST (SAFETY) ×3 IMPLANT
CONTAINER PREFILL 10% NBF 15ML (MISCELLANEOUS) ×6 IMPLANT
DRSG COVADERM PLUS 2X2 (GAUZE/BANDAGES/DRESSINGS) ×3 IMPLANT
ELECT REM PT RETURN 9FT ADLT (ELECTROSURGICAL) ×3
ELECTRODE REM PT RTRN 9FT ADLT (ELECTROSURGICAL) ×1 IMPLANT
GLOVE BIO SURGEON STRL SZ 6.5 (GLOVE) ×2 IMPLANT
GLOVE BIO SURGEONS STRL SZ 6.5 (GLOVE) ×1
GLOVE BIOGEL PI IND STRL 7.0 (GLOVE) ×1 IMPLANT
GLOVE BIOGEL PI INDICATOR 7.0 (GLOVE) ×2
GOWN PREVENTION PLUS LG XLONG (DISPOSABLE) ×6 IMPLANT
NEEDLE HYPO 25X1 1.5 SAFETY (NEEDLE) ×3 IMPLANT
NS IRRIG 1000ML POUR BTL (IV SOLUTION) ×3 IMPLANT
PACK ABDOMINAL MINOR (CUSTOM PROCEDURE TRAY) ×3 IMPLANT
PENCIL BUTTON HOLSTER BLD 10FT (ELECTRODE) ×3 IMPLANT
SPONGE LAP 4X18 X RAY DECT (DISPOSABLE) IMPLANT
STRIP CLOSURE SKIN 1/2X4 (GAUZE/BANDAGES/DRESSINGS) IMPLANT
SUT MNCRL AB 3-0 PS2 27 (SUTURE) ×3 IMPLANT
SUT PLAIN 2 0 (SUTURE) ×4
SUT PLAIN ABS 2-0 CT1 27XMFL (SUTURE) ×2 IMPLANT
SUT VIC AB 0 CT1 27 (SUTURE) ×2
SUT VIC AB 0 CT1 27XBRD ANBCTR (SUTURE) ×1 IMPLANT
SYR BULB IRRIGATION 50ML (SYRINGE) IMPLANT
SYR CONTROL 10ML LL (SYRINGE) ×3 IMPLANT
TOWEL OR 17X24 6PK STRL BLUE (TOWEL DISPOSABLE) ×6 IMPLANT
TRAY FOLEY CATH 14FR (SET/KITS/TRAYS/PACK) ×3 IMPLANT
WATER STERILE IRR 1000ML POUR (IV SOLUTION) ×3 IMPLANT

## 2013-04-03 NOTE — Anesthesia Postprocedure Evaluation (Signed)
  Anesthesia Post-op Note  Patient: Elizabeth Donaldson  Procedure(s) Performed: * No procedures listed *  Patient Location: Mother/Baby  Anesthesia Type:Epidural  Level of Consciousness: awake, alert , oriented and patient cooperative  Airway and Oxygen Therapy: Patient Spontanous Breathing  Post-op Pain: mild  Post-op Assessment: Patient's Cardiovascular Status Stable, Respiratory Function Stable, No headache, No backache, No residual numbness and No residual motor weakness  Post-op Vital Signs: stable  Complications: No apparent anesthesia complications

## 2013-04-03 NOTE — Progress Notes (Signed)
Pt states her water broke at 1300 but wasn't sure she should come to hospital until the evening

## 2013-04-03 NOTE — Progress Notes (Signed)
  Subjective: Pt comfortable with epidural.  Objective: BP 121/99  Pulse 99  Temp(Src) 98.2 F (36.8 C) (Oral)  Resp 18  Ht 5\' 1"  (1.549 m)  Wt 234 lb (106.142 kg)  BMI 44.24 kg/m2  SpO2 100%  LMP 07/16/2012      FHT:  Cat II - position change and bolus UC:   regular, every 2-4 minutes  SVE:   Dilation: 9 Effacement (%): 100 Station: 0 Exam by:: Annamary RummageJ. Latrese Carolan, CNM  Assessment / Plan:  Spontaneous labor, progressing normally  Labor: Progressing normally; Pitocin stopped at 1 miliU d/t variables Preeclampsia: no signs or symptoms of toxicity  Fetal Wellbeing: Category II  Pain Control: Epidural  I/D: GBS neg; SROM; Afebrile Anticipated MOD: NSVD   Ernestina Joe 04/03/2013, 2:59 AM

## 2013-04-03 NOTE — Op Note (Signed)
reop diagnosis: Multiparity desires sterilization  Postop diagnosis: Same  Procedure: Postpartum tubal ligation Surgeon: Tuere Nwosu Anesthesia:Epidural Estimated blood loss: Minimal Complications: None  Pathology: Bilateral portion of tubes Condition and transferred to PACU: stable   Before the tubal ligation was done, the patient was told that the risks are but not limited to bleeding infection damage to internal organs such as bowel bladder major blood vessels. The patient understands that the failure rate is 1 in 200. She understands a half of those failures can result in ectopic or tubal pregnancy. The patient was also well aware of all birth control that was available to her. The patient was taken to the operating room. Once her epidural was found to be adequate, a timeout was done. The patient's consent was signed.  20 cc of local was she used to infiltrate around the umbilicus to obtain adequate anesthesia. This amounts was used because the patient felt prickly touch with the Allis clamp. Once all the anesthesia was found to be adequate, I began the procedure. A 10 mm infraumbilical incision was made along her previous incision. The fascia was then grasped with 2 cokers after the subcutaneous tissue had been bluntly and sharply dissected. The fascia was then incised and extended bilaterally. The peritoneum was entered bluntly. The patient's right fallopian tube was grasped with Babcock clamp. Follow out to the fimbriated end. And a half a centimeter of the mid isthmic portion of the tube was ligated with 2-0 plain and excised. The patient's left fallopian tube was grasped with Babcock clamp followed out to the fimbriated end. About a centimeter of the mid isthmic portion of the tube was ligated with 2-0 plain and excised. Both tubes were returned to the abdomen. Hemostasis was noted. The peritoneum was closed with 0 Vicryl. The fascia was also closed using 0 Vicryl. The skin was closed with 3-0  Monocryl in subcuticular fashion. Sponge lap and needle counts were correct. Patient went to recovery in stable condition.  

## 2013-04-03 NOTE — Progress Notes (Signed)
Patient ID: Elizabeth GinsQuandra L Donaldson, female   DOB: May 02, 1980, 33 y.o.   MRN: [id number] Date of Initial H&P: 1/12  History reviewed, patient examined, no change in status, stable for surgery.

## 2013-04-03 NOTE — Anesthesia Preprocedure Evaluation (Signed)
Anesthesia Evaluation  Patient identified by MRN, date of birth, ID band Patient awake    Reviewed: Allergy & Precautions, H&P , NPO status , Patient's Chart, lab work & pertinent test results, reviewed documented beta blocker date and time   History of Anesthesia Complications Negative for: history of anesthetic complications  Airway Mallampati: II TM Distance: >3 FB Neck ROM: full    Dental  (+) Teeth Intact   Pulmonary neg pulmonary ROS,  breath sounds clear to auscultation        Cardiovascular negative cardio ROS  Rhythm:regular Rate:Normal     Neuro/Psych negative neurological ROS  negative psych ROS   GI/Hepatic negative GI ROS, Neg liver ROS,   Endo/Other  Morbid obesity  Renal/GU negative Renal ROS     Musculoskeletal   Abdominal (+) + obese,   Peds  Hematology negative hematology ROS (+)   Anesthesia Other Findings   Reproductive/Obstetrics (+) Pregnancy                           Anesthesia Physical  Anesthesia Plan  ASA: III  Anesthesia Plan: Epidural   Post-op Pain Management:    Induction:   Airway Management Planned:   Additional Equipment:   Intra-op Plan:   Post-operative Plan:   Informed Consent: I have reviewed the patients History and Physical, chart, labs and discussed the procedure including the risks, benefits and alternatives for the proposed anesthesia with the patient or authorized representative who has indicated his/her understanding and acceptance.     Plan Discussed with: CRNA and Surgeon  Anesthesia Plan Comments: (Pt for PPTL with labor epidural in-situ.)        Anesthesia Quick Evaluation

## 2013-04-03 NOTE — Transfer of Care (Signed)
Immediate Anesthesia Transfer of Care Note  Patient: Elizabeth Donaldson  Procedure(s) Performed: Procedure(s): POST PARTUM TUBAL LIGATION (Bilateral)  Patient Location: PACU  Anesthesia Type:Epidural  Level of Consciousness: awake, alert , oriented and patient cooperative  Airway & Oxygen Therapy: Patient Spontanous Breathing  Post-op Assessment: Report given to PACU RN and Post -op Vital signs reviewed and stable  Post vital signs: Reviewed and stable  Complications: No apparent anesthesia complications

## 2013-04-03 NOTE — Anesthesia Postprocedure Evaluation (Signed)
  Anesthesia Post-op Note  Patient: Elizabeth Donaldson  Procedure(s) Performed: Procedure(s): POST PARTUM TUBAL LIGATION (Bilateral)  Patient Location: PACU  Anesthesia Type:Epidural  Level of Consciousness: awake, alert  and oriented  Airway and Oxygen Therapy: Patient Spontanous Breathing  Post-op Pain: mild  Post-op Assessment: Post-op Vital signs reviewed, Patient's Cardiovascular Status Stable, Respiratory Function Stable, Patent Airway, No signs of Nausea or vomiting, Pain level controlled, No headache and No backache  Post-op Vital Signs: Reviewed and stable  Complications: No apparent anesthesia complications

## 2013-04-04 ENCOUNTER — Encounter (HOSPITAL_COMMUNITY): Payer: Self-pay | Admitting: Obstetrics and Gynecology

## 2013-04-04 LAB — CBC
HCT: 33.9 % — ABNORMAL LOW (ref 36.0–46.0)
Hemoglobin: 11.4 g/dL — ABNORMAL LOW (ref 12.0–15.0)
MCH: 30.6 pg (ref 26.0–34.0)
MCHC: 33.6 g/dL (ref 30.0–36.0)
MCV: 91.1 fL (ref 78.0–100.0)
Platelets: 327 10*3/uL (ref 150–400)
RBC: 3.72 MIL/uL — ABNORMAL LOW (ref 3.87–5.11)
RDW: 13.1 % (ref 11.5–15.5)
WBC: 11 10*3/uL — ABNORMAL HIGH (ref 4.0–10.5)

## 2013-04-04 NOTE — Anesthesia Postprocedure Evaluation (Signed)
  Anesthesia Post-op Note   Anesthesia Post Note  Patient: Elizabeth Donaldson  Procedure(s) Performed: * No procedures listed *  Anesthesia type: Epidural  Patient location: Mother/Baby  Post pain: Pain level controlled  Post assessment: Post-op Vital signs reviewed  Last Vitals:  Filed Vitals:   04/04/13 0845  BP: 125/75  Pulse: 92  Temp: 36.5 C  Resp: 20    Post vital signs: Reviewed  Level of consciousness:alert  Complications: No apparent anesthesia complications

## 2013-04-04 NOTE — Progress Notes (Signed)
Patient ID: Elizabeth Donaldson, female   DOB: 1980-07-29, 33 y.o.   MRN: [id number] Post Partum Day 1, s/p BTL   Subjective: no complaints, up ad lib without syncope, voiding, tolerating PO,  Pain well controlled with po meds,  Bottlefeeding  Mood stable, bonding well Contraception: had BTL    Objective: Blood pressure 109/60, pulse 91, temperature 98.3 F (36.8 C), temperature source Oral, resp. rate 18, height 5\' 1"  (1.549 m), weight 234 lb (106.142 kg), last menstrual period 07/16/2012, SpO2 98.00%, unknown if currently breastfeeding.  Physical Exam:  General: NAD  Lochia: appropriate Uterine Fundus: firm Perineum: DVT Evaluation: No evidence of DVT seen on physical exam. Negative Homan's sign. No significant calf/ankle edema.   Recent Labs  04/02/13 2130 04/04/13 0554  HGB 13.3 11.4*  HCT 38.4 33.9*    Assessment/Plan: Stable postpartum and BTL May want discharged today  bottlefeeding       LOS: 2 days   Kerissa Coia M 04/04/2013, 7:34 AM

## 2013-04-05 LAB — MRSA CULTURE

## 2013-04-05 MED ORDER — OXYCODONE-ACETAMINOPHEN 5-325 MG PO TABS: 1.0000 | ORAL_TABLET | ORAL | Status: DC | PRN

## 2013-04-05 MED ORDER — IBUPROFEN 100 MG/5ML PO SUSP: 600.0000 mg | Freq: Four times a day (QID) | ORAL | Status: DC

## 2013-04-05 NOTE — Discharge Summary (Signed)
Vaginal Delivery Discharge Summary  Elizabeth Donaldson  DOB:    September 25, 1980 MRN:    [id number] CSN:    [id number]  Date of admission:                  04/02/13  Date of discharge:                   04/05/13   Procedures this admission: SVD, post-partum tubal   Date of Delivery: 04/03/13 by Shela Commons.Oxley, CNM   Newborn Data:  Live born female  Birth Weight: 7 lb 7.6 oz (3391 g) APGAR: 8, 9  Home with mother.    History of Present Illness:  Elizabeth Donaldson is a 33 y.o. female, [id number], who presents at [redacted]w[redacted]d weeks gestation. The patient has been followed at the [facility] of [facility]. She was admitted onset of labor. Her pregnancy has been complicated by: none.  Hospital course:  The patient was admitted for labor.   Her labor was not complicated. She proceeded to have a vaginal delivery of a healthy infant. Her delivery was complicated by shoulder dystocia. Her postpartum course was not complicated.  She was discharged to home on postpartum day 2 doing well.  Feeding:  bottle  Contraception:  bilateral tubal ligation  Discharge hemoglobin:  Hemoglobin  Date Value Range Status  04/04/2013 11.4* 12.0 - 15.0 g/dL Final     HCT  Date Value Range Status  04/04/2013 33.9* 36.0 - 46.0 % Final    Discharge Physical Exam:   General: alert and no distress Lochia: appropriate Uterine Fundus: firm Incision: healing well DVT Evaluation: No evidence of DVT seen on physical exam. Negative Homan's sign. No significant calf/ankle edema.  Intrapartum Procedures: spontaneous vaginal delivery and tubal ligation Postpartum Procedures: none Complications-Operative and Postpartum: none  Discharge Diagnoses: Term Pregnancy-delivered  Discharge Information:  Activity:           pelvic rest Diet:                routine Medications: PNV, Ibuprofen and Percocet Condition:      stable Instructions:   Postpartum Care  After Vaginal Delivery  After you deliver your newborn (postpartum period), the usual stay in the hospital is 24 72 hours. If there were problems with your labor or delivery, or if you have other medical problems, you might be in the hospital longer.  While you are in the hospital, you will receive help and instructions on how to care for yourself and your newborn during the postpartum period.  While you are in the hospital:  Be sure to tell your nurses if you have pain or discomfort, as well as where you feel the pain and what makes the pain worse.  If you had an incision made near your vagina (episiotomy) or if you had some tearing during delivery, the nurses may put ice packs on your episiotomy or tear. The ice packs may help to reduce the pain and swelling.  If you are breastfeeding, you may feel uncomfortable contractions of your uterus for a couple of weeks. This is normal. The contractions help your uterus get back to normal size.  It is normal to have some bleeding after delivery.  For the first 1 3 days after delivery, the flow is red and the amount may be similar to a period.  It is common for the flow to start and stop.  In the first few days,  you may pass some small clots. Let your nurses know if you begin to pass large clots or your flow increases.  Do not  flush blood clots down the toilet before having the nurse look at them.  During the next 3 10 days after delivery, your flow should become more watery and pink or brown-tinged in color.  Ten to fourteen days after delivery, your flow should be a small amount of yellowish-white discharge.  The amount of your flow will decrease over the first few weeks after delivery. Your flow may stop in 6 8 weeks. Most women have had their flow stop by 12 weeks after delivery.  You should change your sanitary pads frequently.  Wash your hands thoroughly with soap and water for at least 20 seconds after changing pads, using the toilet,  or before holding or feeding your newborn.  You should feel like you need to empty your bladder within the first 6 8 hours after delivery.  In case you become weak, lightheaded, or faint, call your nurse before you get out of bed for the first time and before you take a shower for the first time.  Within the first few days after delivery, your breasts may begin to feel tender and full. This is called engorgement. Breast tenderness usually goes away within 48 72 hours after engorgement occurs. You may also notice milk leaking from your breasts. If you are not breastfeeding, do not stimulate your breasts. Breast stimulation can make your breasts produce more milk.  Spending as much time as possible with your newborn is very important. During this time, you and your newborn can feel close and get to know each other. Having your newborn stay in your room (rooming in) will help to strengthen the bond with your newborn. It will give you time to get to know your newborn and become comfortable caring for your newborn.  Your hormones change after delivery. Sometimes the hormone changes can temporarily cause you to feel sad or tearful. These feelings should not last more than a few days. If these feelings last longer than that, you should talk to your caregiver.  If desired, talk to your caregiver about methods of family planning or contraception.  Talk to your caregiver about immunizations. Your caregiver may want you to have the following immunizations before leaving the hospital:  Tetanus, diphtheria, and pertussis (Tdap) or tetanus and diphtheria (Td) immunization. It is very important that you and your family (including grandparents) or others caring for your newborn are up-to-date with the Tdap or Td immunizations. The Tdap or Td immunization can help protect your newborn from getting ill.  Rubella immunization.  Varicella (chickenpox) immunization.  Influenza immunization. You should receive this  annual immunization if you did not receive the immunization during your pregnancy. Document Released: 01/03/2007 Document Revised: 12/01/2011 Document Reviewed: 11/03/2011 [facility] Patient Information 2014 [place], [place].   Postpartum Depression and Baby Blues  The postpartum period begins right after the birth of a baby. During this time, there is often a great amount of joy and excitement. It is also a time of considerable changes in the life of the parent(s). Regardless of how many times a mother gives birth, each child brings new challenges and dynamics to the family. It is not unusual to have feelings of excitement accompanied by confusing shifts in moods, emotions, and thoughts. All mothers are at risk of developing postpartum depression or the "baby blues." These mood changes can occur right after giving birth, or they  may occur many months after giving birth. The baby blues or postpartum depression can be mild or severe. Additionally, postpartum depression can resolve rather quickly, or it can be a long-term condition. CAUSES Elevated hormones and their rapid decline are thought to be a main cause of postpartum depression and the baby blues. There are a number of hormones that radically change during and after pregnancy. Estrogen and progesterone usually decrease immediately after delivering your baby. The level of thyroid hormone and various cortisol steroids also rapidly drop. Other factors that play a major role in these changes include major life events and genetics.  RISK FACTORS If you have any of the following risks for the baby blues or postpartum depression, know what symptoms to watch out for during the postpartum period. Risk factors that may increase the likelihood of getting the baby blues or postpartum depression include:  Havinga personal or family history of depression.  Having depression while being pregnant.  Having premenstrual or oral contraceptive-associated mood  issues.  Having exceptional life stress.  Having marital conflict.  Lacking a social support network.  Having a baby with special needs.  Having health problems such as diabetes. SYMPTOMS Baby blues symptoms include:  Brief fluctuations in mood, such as going from extreme happiness to sadness.  Decreased concentration.  Difficulty sleeping.  Crying spells, tearfulness.  Irritability.  Anxiety. Postpartum depression symptoms typically begin within the first month after giving birth. These symptoms include:  Difficulty sleeping or excessive sleepiness.  Marked weight loss.  Agitation.  Feelings of worthlessness.  Lack of interest in activity or food. Postpartum psychosis is a very concerning condition and can be dangerous. Fortunately, it is rare. Displaying any of the following symptoms is cause for immediate medical attention. Postpartum psychosis symptoms include:  Hallucinations and delusions.  Bizarre or disorganized behavior.  Confusion or disorientation. DIAGNOSIS  A diagnosis is made by an evaluation of your symptoms. There are no medical or lab tests that lead to a diagnosis, but there are various questionnaires that a caregiver may use to identify those with the baby blues, postpartum depression, or psychosis. Often times, a screening tool called the [place] Postnatal Depression Scale is used to diagnose depression in the postpartum period.  TREATMENT The baby blues usually goes away on its own in 1 to 2 weeks. Social support is often all that is needed. You should be encouraged to get adequate sleep and rest. Occasionally, you may be given medicines to help you sleep.  Postpartum depression requires treatment as it can last several months or longer if it is not treated. Treatment may include individual or group therapy, medicine, or both to address any social, physiological, and psychological factors that may play a role in the depression. Regular exercise, a  healthy diet, rest, and social support may also be strongly recommended.  Postpartum psychosis is more serious and needs treatment right away. Hospitalization is often needed. HOME CARE INSTRUCTIONS  Get as much rest as you can. Nap when the baby sleeps.  Exercise regularly. Some women find yoga and walking to be beneficial.  Eat a balanced and nourishing diet.  Do little things that you enjoy. Have a cup of tea, take a bubble bath, read your favorite magazine, or listen to your favorite music.  Avoid alcohol.  Ask for help with household chores, cooking, grocery shopping, or running errands as needed. Do not try to do everything.  Talk to people close to you about how you are feeling. Get support from your  partner, family members, friends, or other new moms.  Try to stay positive in how you think. Think about the things you are grateful for.  Do not spend a lot of time alone.  Only take medicine as directed by your caregiver.  Keep all your postpartum appointments.  Let your caregiver know if you have any concerns. SEEK MEDICAL CARE IF: You are having a reaction or problems with your medicine. SEEK IMMEDIATE MEDICAL CARE IF:  You have suicidal feelings.  You feel you may harm the baby or someone else. Document Released: 12/11/2003 Document Revised: 05/31/2011 Document Reviewed: 01/12/2011 [facility] Patient Information 2014 [place], [place].   Discharge to: home  Follow-up Information   Follow up with [facility] In 6 weeks.   Specialty:  Obstetrics and Gynecology   Contact information:   [address] [place] [address] [phone]       Malissa Hippo 04/05/2013

## 2013-04-05 NOTE — Discharge Instructions (Signed)
Vaginal Delivery °Care After °Refer to this sheet in the next few weeks. These discharge instructions provide you with information on caring for yourself after delivery. Your caregiver may also give you specific instructions. Your treatment has been planned according to the most current medical practices available, but problems sometimes occur. Call your caregiver if you have any problems or questions after you go home. °HOME CARE INSTRUCTIONS °· Take over-the-counter or prescription medicines only as directed by your caregiver or pharmacist. °· Do not drink alcohol, especially if you are breastfeeding or taking medicine to relieve pain. °· Do not chew or smoke tobacco. °· Do not use illegal drugs. °· Continue to use good perineal care. Good perineal care includes: °· Wiping your perineum from front to back. °· Keeping your perineum clean. °· Do not use tampons or douche until your caregiver says it is okay. °· Shower, wash your hair, and take tub baths as directed by your caregiver. °· Wear a well-fitting bra that provides breast support. °· Eat healthy foods. °· Drink enough fluids to keep your urine clear or pale yellow. °· Eat high-fiber foods such as whole grain cereals and breads, brown rice, beans, and fresh fruits and vegetables every day. These foods may help prevent or relieve constipation. °· Follow your cargiver's recommendations regarding resumption of activities such as climbing stairs, driving, lifting, exercising, or traveling. °· Talk to your caregiver about resuming sexual activities. Resumption of sexual activities is dependent upon your risk of infection, your rate of healing, and your comfort and desire to resume sexual activity. °· Try to have someone help you with your household activities and your newborn for at least a few days after you leave the hospital. °· Rest as much as possible. Try to rest or take a nap when your newborn is sleeping. °· Increase your activities gradually. °· Keep all  of your scheduled postpartum appointments. It is very important to keep your scheduled follow-up appointments. At these appointments, your caregiver will be checking to make sure that you are healing physically and emotionally. °SEEK MEDICAL CARE IF:  °· You are passing large clots from your vagina. Save any clots to show your caregiver. °· You have a foul smelling discharge from your vagina. °· You have trouble urinating. °· You are urinating frequently. °· You have pain when you urinate. °· You have a change in your bowel movements. °· You have increasing redness, pain, or swelling near your vaginal incision (episiotomy) or vaginal tear. °· You have pus draining from your episiotomy or vaginal tear. °· Your episiotomy or vaginal tear is separating. °· You have painful, hard, or reddened breasts. °· You have a severe headache. °· You have blurred vision or see spots. °· You feel sad or depressed. °· You have thoughts of hurting yourself or your newborn. °· You have questions about your care, the care of your newborn, or medicines. °· You are dizzy or lightheaded. °· You have a rash. °· You have nausea or vomiting. °· You were breastfeeding and have not had a menstrual period within 12 weeks after you stopped breastfeeding. °· You are not breastfeeding and have not had a menstrual period by the 12th week after delivery. °· You have a fever. °SEEK IMMEDIATE MEDICAL CARE IF:  °· You have persistent pain. °· You have chest pain. °· You have shortness of breath. °· You faint. °· You have leg pain. °· You have stomach pain. °· Your vaginal bleeding saturates two or more sanitary pads   in 1 hour. °MAKE SURE YOU:  °· Understand these instructions. °· Will watch your condition. °· Will get help right away if you are not doing well or get worse. °Document Released: 03/05/2000 Document Revised: 12/01/2011 Document Reviewed: 11/03/2011 °[employer]® Patient Information ©2014 [employer]. ° °Laparoscopic Tubal Ligation °Care  After °Refer to this sheet in the next few weeks. These instructions provide you with information on caring for yourself after your procedure. Your caregiver may also give you more specific instructions. Your treatment has been planned according to current medical practices, but problems sometimes occur. Call your caregiver if you have any problems or questions after your procedure. °HOME CARE INSTRUCTIONS  °· Rest the remainder of the day. °· Only take over-the-counter or prescription medicines for pain, discomfort, or fever as directed by your caregiver. Do not take aspirin. It can cause bleeding. °· Gradually resume daily activities, diet, rest, driving, and work. °· Avoid sexual intercourse for 2 weeks or as directed. °· Do not use tampons or douche. °· Do not drive while taking pain medicine. °· Do not lift anything over 5 pounds for 2 weeks or as directed. °· Only take showers, not baths, until you are seen by your caregiver. °· Change bandages (dressings) as directed. °· Take your temperature twice a day and record it. °· Try to have help for the first 7 to 10 days for your household needs. °· Return to your caregiver to get your stitches (sutures) removed and for follow-up visits as directed. °SEEK MEDICAL CARE IF:  °· You have redness, swelling, or increasing pain in a wound. °· You have drainage from a wound lasting longer than 1 day. °· Your pain is getting worse. °· You have a rash. °· You become dizzy or lightheaded. °· You have a reaction to your medicine. °· You need stronger medicine or a change in your pain medicine. °· You notice a bad smell coming from a wound or dressing. °· Your wound breaks open after the sutures have been removed. °· You are constipated. °SEEK IMMEDIATE MEDICAL CARE IF:  °· You faint. °· You have a fever. °· You have increasing abdominal pain. °· You have severe pain in your shoulders. °· You have bleeding or drainage from the suture sites or vagina following surgery. °· You  have shortness of breath or difficulty breathing. °· You have chest or leg pain. °· You have persistent nausea, vomiting, or diarrhea. °MAKE SURE YOU:  °· Understand these instructions. °· Watch your condition. °· Get help right away if you are not doing well or get worse. °Document Released: 09/25/2004 Document Revised: 09/07/2011 Document Reviewed: 06/19/2011 °[employer]® Patient Information ©2014 [employer]. ° °Postpartum Depression and Baby Blues °The postpartum period begins right after the birth of a baby. During this time, there is often a great amount of joy and excitement. It is also a time of considerable changes in the life of the parent(s). Regardless of how many times a mother gives birth, each child brings new challenges and dynamics to the family. It is not unusual to have feelings of excitement accompanied by confusing shifts in moods, emotions, and thoughts. All mothers are at risk of developing postpartum depression or the "baby blues." These mood changes can occur right after giving birth, or they may occur many months after giving birth. The baby blues or postpartum depression can be mild or severe. Additionally, postpartum depression can resolve rather quickly, or it can be a long-term condition. °CAUSES °Elevated hormones and their   rapid decline are thought to be a main cause of postpartum depression and the baby blues. There are a number of hormones that radically change during and after pregnancy. Estrogen and progesterone usually decrease immediately after delivering your baby. The level of thyroid hormone and various cortisol steroids also rapidly drop. Other factors that play a major role in these changes include major life events and genetics.  °RISK FACTORS °If you have any of the following risks for the baby blues or postpartum depression, know what symptoms to watch out for during the postpartum period. Risk factors that may increase the likelihood of getting the baby blues or  postpartum depression include: °· Having a personal or family history of depression. °· Having depression while being pregnant. °· Having premenstrual or oral contraceptive-associated mood issues. °· Having exceptional life stress. °· Having marital conflict. °· Lacking a social support network. °· Having a baby with special needs. °· Having health problems such as diabetes. °SYMPTOMS °Baby blues symptoms include: °· Brief fluctuations in mood, such as going from extreme happiness to sadness. °· Decreased concentration. °· Difficulty sleeping. °· Crying spells, tearfulness. °· Irritability. °· Anxiety. °Postpartum depression symptoms typically begin within the first month after giving birth. These symptoms include: °· Difficulty sleeping or excessive sleepiness. °· Marked weight loss. °· Agitation. °· Feelings of worthlessness. °· Lack of interest in activity or food. °Postpartum psychosis is a very concerning condition and can be dangerous. Fortunately, it is rare. Displaying any of the following symptoms is cause for immediate medical attention. Postpartum psychosis symptoms include: °· Hallucinations and delusions. °· Bizarre or disorganized behavior. °· Confusion or disorientation. °DIAGNOSIS  °A diagnosis is made by an evaluation of your symptoms. There are no medical or lab tests that lead to a diagnosis, but there are various questionnaires that a caregiver may use to identify those with the baby blues, postpartum depression, or psychosis. Often times, a screening tool called the Edinburgh Postnatal Depression Scale is used to diagnose depression in the postpartum period.  °TREATMENT °The baby blues usually goes away on its own in 1 to 2 weeks. Social support is often all that is needed. You should be encouraged to get adequate sleep and rest. Occasionally, you may be given medicines to help you sleep.  °Postpartum depression requires treatment as it can last several months or longer if it is not treated.  Treatment may include individual or group therapy, medicine, or both to address any social, physiological, and psychological factors that may play a role in the depression. Regular exercise, a healthy diet, rest, and social support may also be strongly recommended.  °Postpartum psychosis is more serious and needs treatment right away. Hospitalization is often needed. °HOME CARE INSTRUCTIONS °· Get as much rest as you can. Nap when the baby sleeps. °· Exercise regularly. Some women find yoga and walking to be beneficial. °· Eat a balanced and nourishing diet. °· Do little things that you enjoy. Have a cup of tea, take a bubble bath, read your favorite magazine, or listen to your favorite music. °· Avoid alcohol. °· Ask for help with household chores, cooking, grocery shopping, or running errands as needed. Do not try to do everything. °· Talk to people close to you about how you are feeling. Get support from your partner, family members, friends, or other new moms. °· Try to stay positive in how you think. Think about the things you are grateful for. °· Do not spend a lot of time alone. °· Only take   medicine as directed by your caregiver. °· Keep all your postpartum appointments. °· Let your caregiver know if you have any concerns. °SEEK MEDICAL CARE IF: °You are having a reaction or problems with your medicine. °SEEK IMMEDIATE MEDICAL CARE IF: °· You have suicidal feelings. °· You feel you may harm the baby or someone else. °Document Released: 12/11/2003 Document Revised: 05/31/2011 Document Reviewed: 12/18/2012 °ExitCare® Patient Information ©2014 [employer]. ° °

## 2013-07-01 IMAGING — US US OB COMP LESS 14 WK
1 series · 14 of 23 positions shown · non-contrast
Comparison: None.

CLINICAL DATA: Pregnant, spotting

OBSTETRIC <14 WK ULTRASOUND
TECHNIQUE: Transabdominal ultrasound was performed for evaluation
of the gestation as well as the maternal uterus and adnexal
regions.

[Series 1: us ob comp less 14 wks · 14 of 23 slices shown]
[im 1/23]
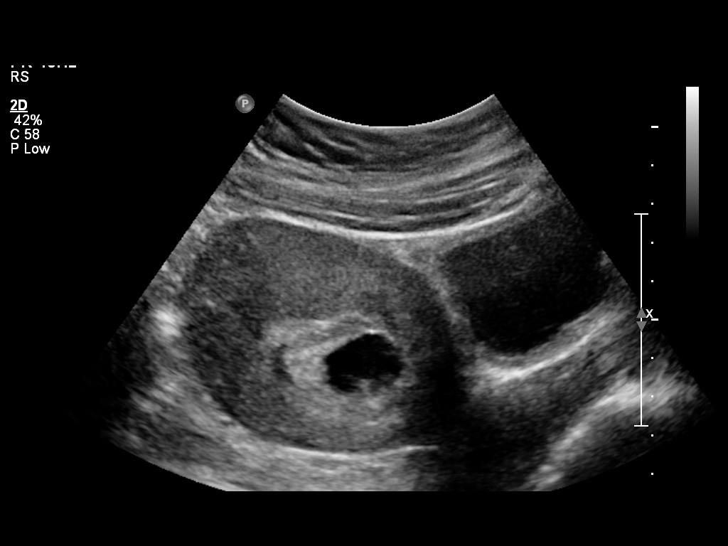
[im 3/23]
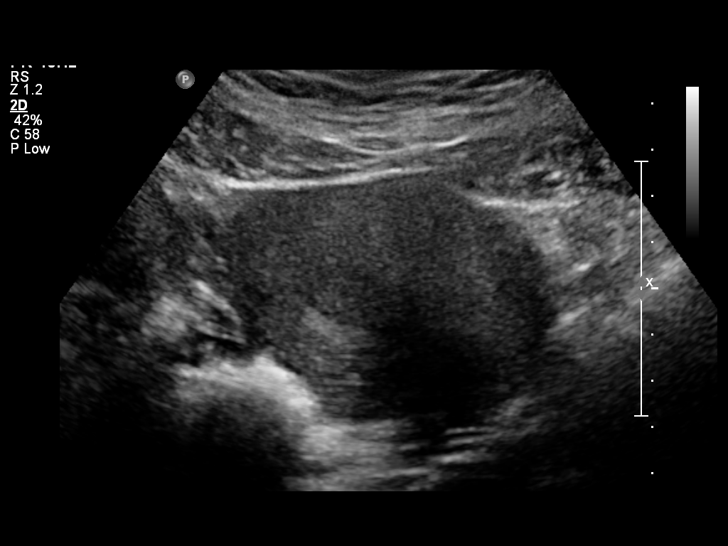
[im 5/23]
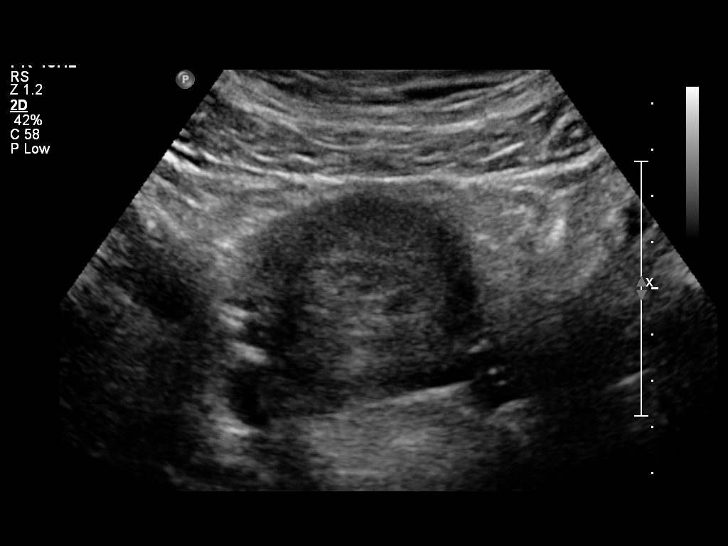
[im 6/23]
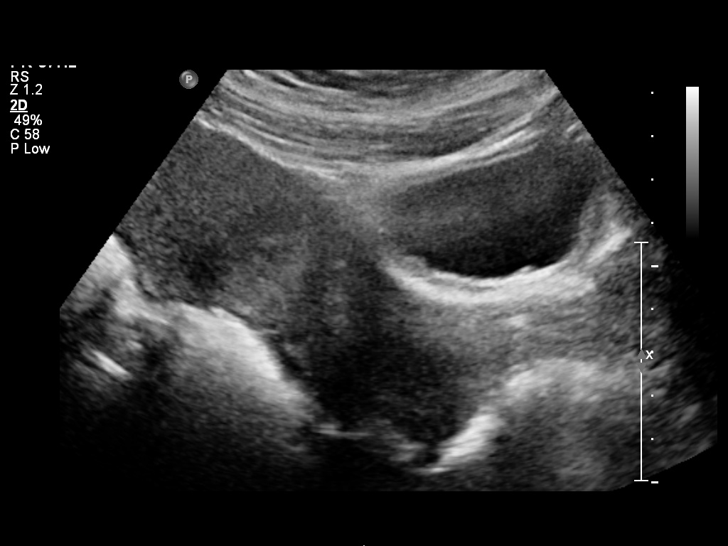
[im 8/23]
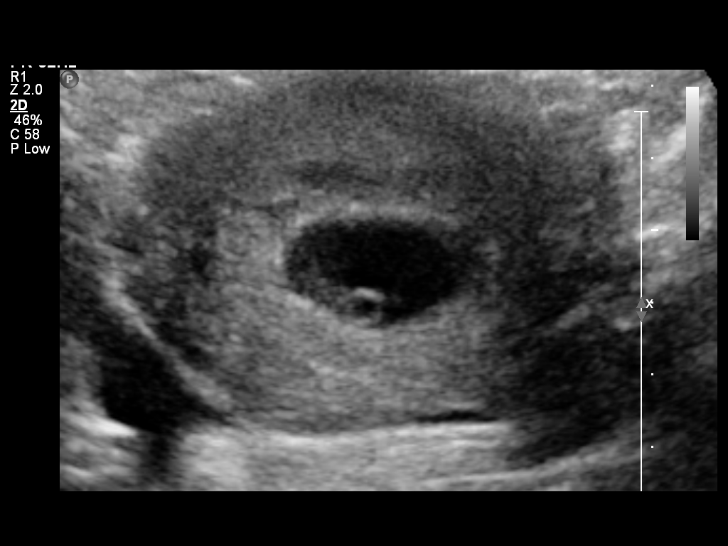
[im 10/23]
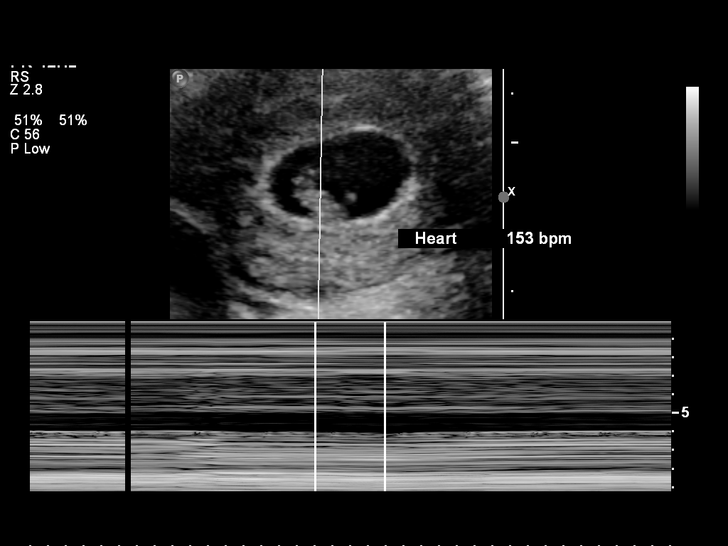
[im 11/23]
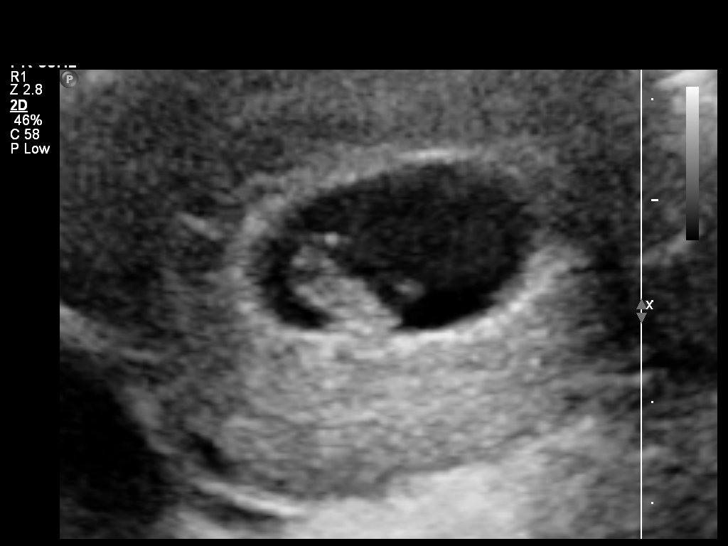
[im 13/23]
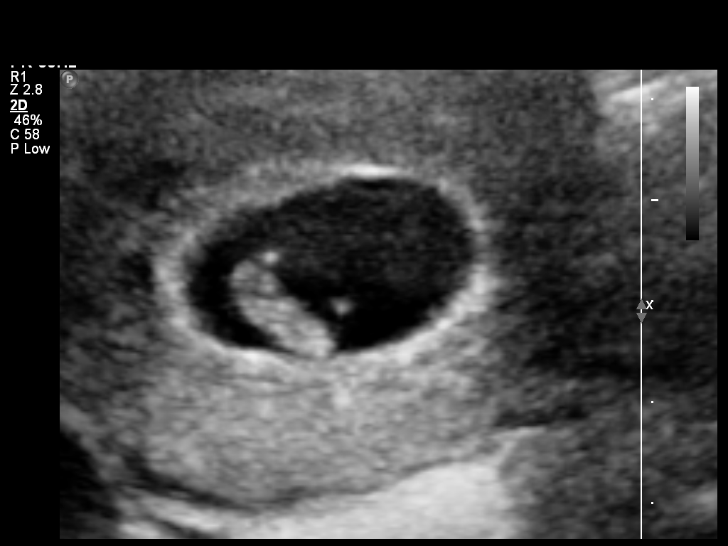
[im 14/23]
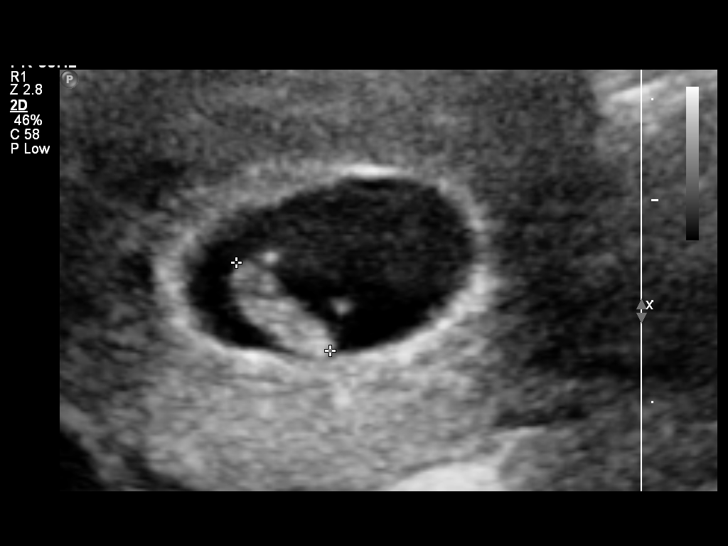
[im 16/23]
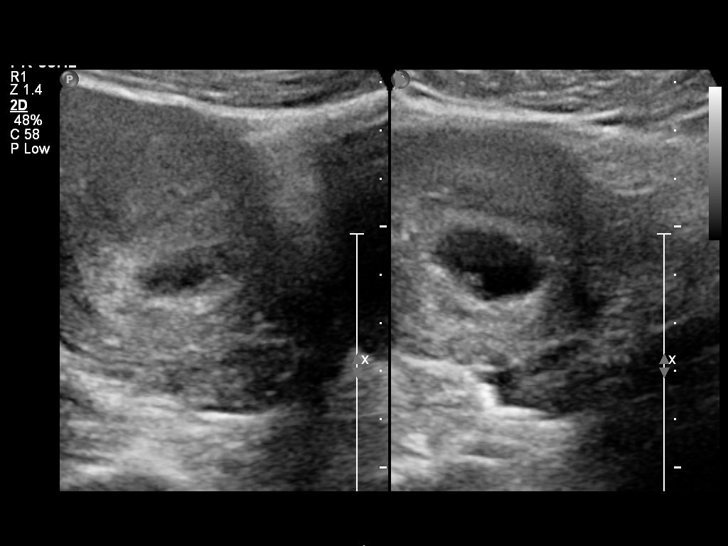
[im 18/23]
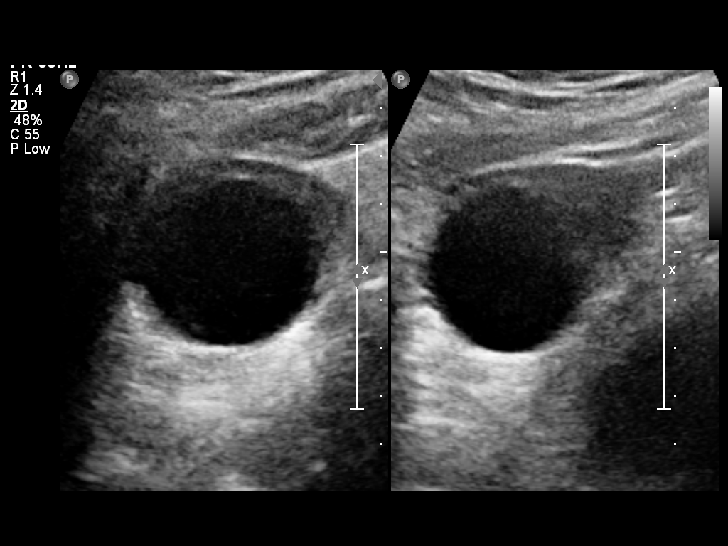
[im 19/23]
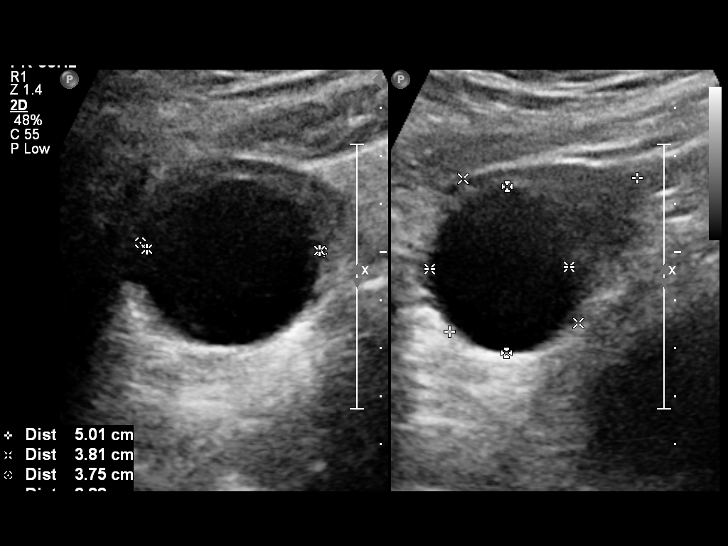
[im 21/23]
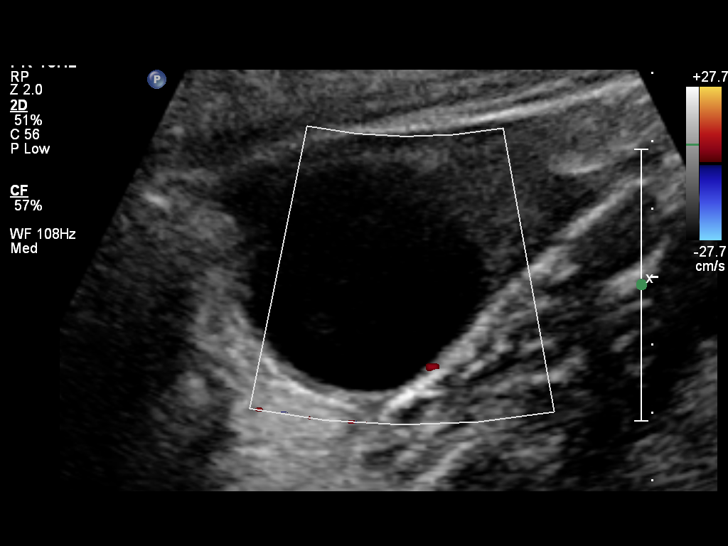
[im 23/23]
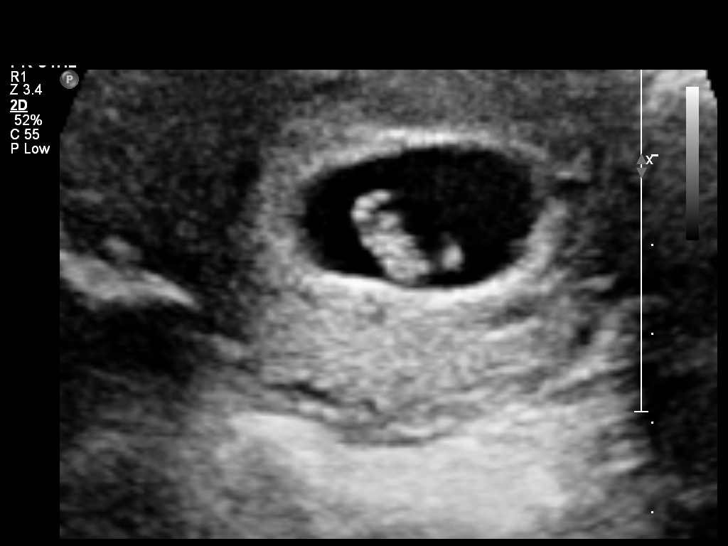

[14 of 23 positions shown; findings below may reference images not displayed]

Intrauterine gestational sac: Visualized/normal in shape.
Yolk sac: Present
Embryo: Present
Cardiac Activity: Present
Heart Rate: 153 bpm

CRL:  12 mm  7 w  4 d         US EDC: 04/04/2013

Maternal uterus/Adnexae:
Small subchronic hemorrhage.

Left ovary is within normal limits, measuring 1.2 x 2.4 x 1.5 cm.

Right ovary measures 3.8 x 5.0 x 3.8 cm and is notable for a 3.4 x
2.9 x 3.6 cm corpus luteal cyst.

No free fluid.
IMPRESSION: Single live intrauterine gestation with estimated gestational age 7
weeks 4 days by crown-rump length.

## 2014-01-21 ENCOUNTER — Encounter (HOSPITAL_COMMUNITY): Payer: Self-pay | Admitting: Obstetrics and Gynecology

## 2015-08-22 ENCOUNTER — Ambulatory Visit (HOSPITAL_COMMUNITY)
Admission: EM | Admit: 2015-08-22 | Discharge: 2015-08-22 | Disposition: A | Discharge status: Home / Self Care | Payer: Managed Care, Other (non HMO) | Attending: Emergency Medicine | Admitting: Emergency Medicine

## 2015-08-22 ENCOUNTER — Encounter (HOSPITAL_COMMUNITY): Payer: Self-pay

## 2015-08-22 DIAGNOSIS — R519 Headache, unspecified: Secondary | ICD-10-CM

## 2015-08-22 DIAGNOSIS — R51 Headache: Secondary | ICD-10-CM | POA: Diagnosis not present

## 2015-08-22 MED ORDER — KETOROLAC TROMETHAMINE 60 MG/2ML IM SOLN
INTRAMUSCULAR | Status: AC
  Filled 2015-08-22: qty 2

## 2015-08-22 MED ORDER — DEXAMETHASONE SODIUM PHOSPHATE 10 MG/ML IJ SOLN
10.0000 mg | Freq: Once | INTRAMUSCULAR | Status: AC
  Administered 2015-08-22: 10 mg via INTRAMUSCULAR

## 2015-08-22 MED ORDER — KETOROLAC TROMETHAMINE 60 MG/2ML IM SOLN
60.0000 mg | Freq: Once | INTRAMUSCULAR | Status: AC
  Administered 2015-08-22: 60 mg via INTRAMUSCULAR

## 2015-08-22 MED ORDER — DEXAMETHASONE SODIUM PHOSPHATE 10 MG/ML IJ SOLN
INTRAMUSCULAR | Status: AC
  Filled 2015-08-22: qty 1

## 2015-08-22 NOTE — ED Provider Notes (Signed)
CSN: [id number]     Arrival date & time 08/22/15  1402 History   First MD Initiated Contact with Patient 08/22/15 1508     Chief Complaint  Patient presents with  . Headache   (Consider location/radiation/quality/duration/timing/severity/associated sxs/prior Treatment) HPI Elizabeth Donaldson is a 35 year old woman here for evaluation of headache. Elizabeth Donaldson states for the last 2 days Elizabeth Donaldson has had a headache. It is primarily located across her forehead and throbbing in nature. Sometimes it will radiate to the back of her head. Sometimes speaking loudly will make it worse, but denies sensitivity to sound or light. Elizabeth Donaldson did have one episode where it made her nauseous yesterday, but has not had any vomiting. Elizabeth Donaldson denies any change in her vision. No difficulty with speech or swallowing. No focal numbness, tingling, weakness. No sense of disequilibrium or being off balance. Elizabeth Donaldson states the headache will feel a little worse if Elizabeth Donaldson leans forward. Elizabeth Donaldson has tried Naprosyn which did relieve the headache, but the headache came back when the medicine wore off. Elizabeth Donaldson does have a history of intermittent headaches, but this feels a little different. Elizabeth Donaldson does have a positive family history for migraine headaches. No recent injury or trauma. No increased stress.  Past Medical History  Diagnosis Date  . Labial abscess    Past Surgical History  Procedure Laterality Date  . Tubal ligation Bilateral 04/03/2013    Procedure: POST PARTUM TUBAL LIGATION;  Surgeon: Michael Litter, MD;  Location: [facility];  Service: Gynecology;  Laterality: Bilateral;   Family History  Problem Relation Age of Onset  . Hypertension Mother   . Hypertension Father    Social History  Substance Use Topics  . Smoking status: Never Smoker   . Smokeless tobacco: Never Used  . Alcohol Use: No   OB History    Gravida Para Term Preterm AB TAB SAB Ectopic Multiple Living   Review of Systems As in history of present illness Allergies  Review of  patient's allergies indicates no known allergies.  Home Medications   Prior to Admission medications   Medication Sig Start Date End Date Taking? Authorizing Provider  ibuprofen (ADVIL,MOTRIN) 100 MG/5ML suspension Take 30 mLs (600 mg total) by mouth every 6 (six) hours. 04/05/13   Sanda Klein, CNM  oxyCODONE-acetaminophen (PERCOCET/ROXICET) 5-325 MG per tablet Take 1-2 tablets by mouth every 4 (four) hours as needed for severe pain (moderate - severe pain). 04/05/13   Sanda Klein, CNM  Prenatal Vit-Fe Fumarate-FA (PRENATAL MULTIVITAMIN) TABS Take 1 tablet by mouth daily at 12 noon.    Historical Provider, MD   Meds Ordered and Administered this Visit   Medications  ketorolac (TORADOL) injection 60 mg (60 mg Intramuscular Given 08/22/15 1525)  dexamethasone (DECADRON) injection 10 mg (10 mg Intramuscular Given 08/22/15 1525)    BP 120/79 mmHg  Pulse 65  Temp(Src) 99 F (37.2 C) (Oral)  SpO2 100%  LMP 08/20/2015 (Approximate) No data found.   Physical Exam  Constitutional: Elizabeth Donaldson is oriented to person, place, and time. Elizabeth Donaldson appears well-developed and well-nourished. No distress.  Eyes: Conjunctivae and EOM are normal. Pupils are equal, round, and reactive to light.  No papilledema appreciated.  Neck: Neck supple.  Cardiovascular: Normal rate, regular rhythm and normal heart sounds.   No murmur heard. Pulmonary/Chest: Effort normal and breath sounds normal. No respiratory distress. Elizabeth Donaldson has no wheezes. Elizabeth Donaldson has no rales.  Neurological: Elizabeth Donaldson is alert and oriented  to person, place, and time. No cranial nerve deficit. Elizabeth Donaldson exhibits normal muscle tone. Coordination normal.  Negative Romberg    ED Course  Procedures (including critical care time)  Labs Review Labs Reviewed - No data to display  Imaging Review No results found.   MDM   1. Headache, unspecified headache type    Headache has elements of migraine, tension, and sinus headaches. No neurologic findings or red flags  for intracranial process. We'll treat with Toradol and Decadron here. Recommended follow-up with PCP in 1-2 weeks if headaches are persistent. Reasons to go to the ER reviewed.    Charm RingsErin J Holiday Mcmenamin, MD 08/22/15 [phone]

## 2015-08-22 NOTE — Discharge Instructions (Signed)
Your headache has elements of a migraine headache, tension headache, and sinus headache. There is no sign of intracranial injury or cause for headache. We gave you some medicines here to try and break the headache. If your headaches are persisting, please follow-up with your primary care doctor in 1-2 weeks. If your headache changes or gets acutely worse, you develop vision problems, trouble with your balance, or weakness on one side of your body, please go directly to the emergency room.

## 2015-08-22 NOTE — ED Notes (Signed)
Patient presents with headache x3 days across forehead area, pt has taken Naproxen to treat pain last time taken was this morning, she experiencing nausea No acute distress

## 2015-12-31 ENCOUNTER — Ambulatory Visit (HOSPITAL_COMMUNITY)
Admission: EM | Admit: 2015-12-31 | Discharge: 2015-12-31 | Disposition: A | Discharge status: Home / Self Care | Payer: Managed Care, Other (non HMO) | Attending: Physician Assistant | Admitting: Physician Assistant

## 2015-12-31 ENCOUNTER — Encounter (HOSPITAL_COMMUNITY): Payer: Self-pay | Admitting: Emergency Medicine

## 2015-12-31 DIAGNOSIS — H6121 Impacted cerumen, right ear: Secondary | ICD-10-CM

## 2015-12-31 MED ORDER — CARBAMIDE PEROXIDE 6.5 % OT SOLN: OTIC | 3 refills | Status: DC

## 2015-12-31 NOTE — ED Triage Notes (Signed)
The patient presented to the [facility] with a complaint of right ear pain x 2 days. 

## 2015-12-31 NOTE — ED Provider Notes (Signed)
CSN: [id number]     Arrival date & time 12/31/15  1019 History   First MD Initiated Contact with Patient 12/31/15 1159     Chief Complaint  Patient presents with  . Otalgia   (Consider location/radiation/quality/duration/timing/severity/associated sxs/prior Treatment) HPI np 35 y/o female with pain and decreased hearing sxs present for 2 days. No home tx. Past Medical History:  Diagnosis Date  . Labial abscess    Past Surgical History:  Procedure Laterality Date  . TUBAL LIGATION Bilateral 04/03/2013   Procedure: POST PARTUM TUBAL LIGATION;  Surgeon: Michael LitterNaima A Dillard, MD;  Location: [facility];  Service: Gynecology;  Laterality: Bilateral;   Family History  Problem Relation Age of Onset  . Hypertension Mother   . Hypertension Father    Social History  Substance Use Topics  . Smoking status: Never Smoker  . Smokeless tobacco: Never Used  . Alcohol use No   OB History    Gravida Para Term Preterm AB Living   2 2 2     2    SAB TAB Ectopic Multiple Live Births           2     Review of Systems  Denies: HEADACHE, NAUSEA, ABDOMINAL PAIN, CHEST PAIN, CONGESTION, DYSURIA, SHORTNESS OF BREATH  Allergies  Review of patient's allergies indicates no known allergies.  Home Medications   Prior to Admission medications   Medication Sig Start Date End Date Taking? Authorizing Provider  carbamide peroxide (DEBROX) 6.5 % otic solution 5 drops each ear every other Saturday starting October 20. 12/31/15   Tharon AquasFrank C Tiawana Forgy, PA  ibuprofen (ADVIL,MOTRIN) 100 MG/5ML suspension Take 30 mLs (600 mg total) by mouth every 6 (six) hours. 04/05/13   Sanda KleinShelley Lillard, CNM  oxyCODONE-acetaminophen (PERCOCET/ROXICET) 5-325 MG per tablet Take 1-2 tablets by mouth every 4 (four) hours as needed for severe pain (moderate - severe pain). 04/05/13   Sanda KleinShelley Lillard, CNM  Prenatal Vit-Fe Fumarate-FA (PRENATAL MULTIVITAMIN) TABS Take 1 tablet by mouth daily at 12 noon.    Historical Provider, MD   Meds Ordered  and Administered this Visit  Medications - No data to display  BP 109/77 (BP Location: Right Arm)   Pulse 75   Temp 98.4 F (36.9 C) (Oral)   Resp 18   LMP 12/08/2015 (Approximate)   SpO2 100%  No data found.   Physical Exam Physical Exam   NURSES NOTES AND VITAL SIGNS REVIEWED. CONSTITUTIONAL: Well developed, well nourished, no acute distress HEENT: normocephalic, atraumatic right EAC is occluded with wax.  EYES: Conjunctiva normal NECK:normal ROM, supple, no adenopathy PULMONARY:No respiratory distress, normal effort ABDOMINAL: Soft, ND, NT BS+, No CVAT MUSCULOSKELETAL: Normal ROM of all extremities,  SKIN: warm and dry without rash PSYCHIATRIC: Mood and affect, behavior are normal   Urgent Care Course   Clinical Course  after irrigation, pt states pain free, hearing normal.   Procedures (including critical care time)  Labs Review Labs Reviewed - No data to display  Imaging Review No results found.   Visual Acuity Review  Right Eye Distance:   Left Eye Distance:   Bilateral Distance:    Right Eye Near:   Left Eye Near:    Bilateral Near:         MDM   1. Impacted cerumen of right ear     Patient is reassured that there are no issues that require transfer to higher level of care at this time or additional tests. Patient is advised to continue home symptomatic treatment.  Patient is advised that if there are new or worsening symptoms to attend the emergency department, contact primary care provider, or return to UC. Instructions of care provided discharged home in stable condition.    THIS NOTE WAS GENERATED USING A VOICE RECOGNITION SOFTWARE PROGRAM. ALL REASONABLE EFFORTS  WERE MADE TO PROOFREAD THIS DOCUMENT FOR ACCURACY.  I have verbally reviewed the discharge instructions with the patient. A printed AVS was given to the patient.  All questions were answered prior to discharge.      Tharon Aquas, PA 12/31/15 2103

## 2016-12-02 ENCOUNTER — Encounter (HOSPITAL_COMMUNITY): Payer: Self-pay | Admitting: Emergency Medicine

## 2016-12-02 ENCOUNTER — Ambulatory Visit (HOSPITAL_COMMUNITY)
Admission: EM | Admit: 2016-12-02 | Discharge: 2016-12-02 | Disposition: A | Discharge status: Home / Self Care | Payer: Managed Care, Other (non HMO) | Attending: Family Medicine | Admitting: Family Medicine

## 2016-12-02 DIAGNOSIS — S43422A Sprain of left rotator cuff capsule, initial encounter: Secondary | ICD-10-CM | POA: Diagnosis not present

## 2016-12-02 MED ORDER — CYCLOBENZAPRINE HCL 10 MG PO TABS: 10.0000 mg | ORAL_TABLET | Freq: Two times a day (BID) | ORAL | 0 refills | Status: DC | PRN

## 2016-12-02 MED ORDER — DICLOFENAC SODIUM 75 MG PO TBEC: 75.0000 mg | DELAYED_RELEASE_TABLET | Freq: Two times a day (BID) | ORAL | 0 refills | Status: DC

## 2016-12-02 NOTE — ED Triage Notes (Signed)
Complains of left shoulder pain .  Onset of pain on Sunday.  Pain then resolved.  Pain returned this Wednesday and was worse than initially.  No known injury.  Patient does not connect pain with any routine, repetitive movement.  Patient thinks may be related to "horsing around, rough housing" at home .  Patient is right handed

## 2016-12-02 NOTE — ED Provider Notes (Signed)
[facility]   [id number] 12/02/16 Arrival Time: 1005   SUBJECTIVE:  Elizabeth Donaldson is a 36 y.o. female who presents to the urgent care with complaint of left shoulder pain. States she has had continuous pain since this past Wednesday, and that she was "horsing around" at home when the pain started. She denies working in a job requiring repetitive motion, lifting heavy objects, she has not fallen, or had other sources of trauma. Pain is worse with movement of the shoulder, and states she is unable to raise her arm above her head. She is right handed.     Past Medical History:  Diagnosis Date  . Labial abscess    Family History  Problem Relation Age of Onset  . Hypertension Mother   . Hypertension Father    Social History   Social History  . Marital status: Single    Spouse name: N/A  . Number of children: N/A  . Years of education: N/A   Occupational History  . Not on file.   Social History Main Topics  . Smoking status: Never Smoker  . Smokeless tobacco: Never Used  . Alcohol use No  . Drug use: No  . Sexual activity: Not Currently    Birth control/ protection: None     Comment: pregnant   Other Topics Concern  . Not on file   Social History Narrative  . No narrative on file   Current Meds  Medication Sig  . Prenatal Vit-Fe Fumarate-FA (PRENATAL MULTIVITAMIN) TABS Take 1 tablet by mouth daily at 12 noon.  . [DISCONTINUED] NAPROXEN PO Take by mouth.   No Known Allergies    ROS: As per HPI, remainder of ROS negative.   OBJECTIVE:   Vitals:   12/02/16 1050  BP: 137/67  Pulse: 77  Resp: 18  Temp: 98 F (36.7 C)  TempSrc: Oral  SpO2: 100%     General appearance: alert; no distress Eyes:  conjunctiva normal HENT: normocephalic; atraumatic Neck: supple, No tenderness with range of motion Lungs: clear to auscultation bilaterally Heart: regular rate and rhythm Extremities: no cyanosis or edema; symmetrical with no gross deformities,  no tenderness along the clavicle, scapula, or tenderness with palpation of the infraspinatus her trapezius muscle, there is point tenderness at the insertion of the biceps tendon, and pain with flexion of the shoulder to 30 and extension of the shoulder to 20, and pain with abduction Skin: warm and dry Neurologic: normal gait; grossly normal Psychological: alert and cooperative; normal mood and affect      Labs:   Labs Reviewed - No data to display  No results found.     ASSESSMENT & PLAN:  1. Sprain of left rotator cuff capsule, initial encounter     Meds ordered this encounter  Medications  . DISCONTD: NAPROXEN PO    Sig: Take by mouth.  . diclofenac (VOLTAREN) 75 MG EC tablet    Sig: Take 1 tablet (75 mg total) by mouth 2 (two) times daily.    Dispense:  20 tablet    Refill:  0  . cyclobenzaprine (FLEXERIL) 10 MG tablet    Sig: Take 1 tablet (10 mg total) by mouth 2 (two) times daily as needed for muscle spasms.    Dispense:  20 tablet    Refill:  0   Follow up with orthopedics if pain persists Reviewed expectations re: course of current medical issues. Questions answered. Outlined signs and symptoms indicating need for more acute intervention. Patient verbalized  understanding. After Visit Summary given.    Procedures:        Dorena BodoKennard, Karime Scheuermann, NP 12/02/16 1156

## 2016-12-02 NOTE — Discharge Instructions (Signed)
You most likely have a sprain of your rotator cuff I have prescribed two medicines for your pain. The first is diclofenac, take 1 tablet twice a day and the other is Flexeril, take 1 tablet twice a day. Flexeril may cause drowsiness so do not drive until you know how this medicine affects you. Also do not drink any alcohol either. You may apply ice and alternate with heat for 15 minutes at a time 4 times daily and for additional pain control you may take tylenol over the counter ever 4 hours but do not take more than 4000 mg a day. Should your pain continue or fail to resolve, follow up with your primary care provider or return to clinic as needed.

## 2017-05-03 ENCOUNTER — Other Ambulatory Visit: Payer: Self-pay | Admitting: Physician Assistant

## 2019-09-01 ENCOUNTER — Ambulatory Visit: Payer: Managed Care, Other (non HMO) | Attending: Internal Medicine

## 2019-09-01 DIAGNOSIS — Z23 Encounter for immunization: Secondary | ICD-10-CM

## 2019-09-01 NOTE — Progress Notes (Signed)
   Covid-19 Vaccination Clinic  Name:  SUSAN BLEICH    MRN: [id number] DOB: 03-08-1981  09/01/2019  Ms. Manninen was observed post Covid-19 immunization for 15 minutes without incident. She was provided with Vaccine Information Sheet and instruction to access the V-Safe system.   Ms. Dykes was instructed to call 911 with any severe reactions post vaccine: Marland Kitchen Difficulty breathing  . Swelling of face and throat  . A fast heartbeat  . A bad rash all over body  . Dizziness and weakness   Immunizations Administered    Name Date Dose VIS Date Route   Pfizer COVID-19 Vaccine 09/01/2019  9:34 AM 0.3 mL 05/16/2018 Intramuscular   Manufacturer: [employer], [employer]   Lot: [id number]   NDC: [address]-2

## 2019-09-29 ENCOUNTER — Ambulatory Visit: Payer: Managed Care, Other (non HMO) | Attending: Internal Medicine

## 2019-09-29 DIAGNOSIS — Z23 Encounter for immunization: Secondary | ICD-10-CM

## 2019-09-29 NOTE — Progress Notes (Signed)
   Covid-19 Vaccination Clinic  Name:  SCHAE CANDO    MRN: [id number] DOB: Jul 24, 1980  09/29/2019  Ms. Morici was observed post Covid-19 immunization for 15 minutes without incident. She was provided with Vaccine Information Sheet and instruction to access the V-Safe system.   Ms. Franze was instructed to call 911 with any severe reactions post vaccine: Marland Kitchen Difficulty breathing  . Swelling of face and throat  . A fast heartbeat  . A bad rash all over body  . Dizziness and weakness   Immunizations Administered    Name Date Dose VIS Date Route   Pfizer COVID-19 Vaccine 09/29/2019  8:12 AM 0.3 mL 05/16/2018 Intramuscular   Manufacturer: [employer], [employer]   Lot: [id number]   NDC: [address]-9

## 2019-12-12 ENCOUNTER — Ambulatory Visit: Payer: Self-pay | Admitting: Podiatry

## 2019-12-13 ENCOUNTER — Other Ambulatory Visit: Payer: Self-pay

## 2019-12-13 ENCOUNTER — Encounter: Payer: Self-pay | Admitting: Podiatry

## 2019-12-13 ENCOUNTER — Ambulatory Visit (INDEPENDENT_AMBULATORY_CARE_PROVIDER_SITE_OTHER): Payer: Managed Care, Other (non HMO) | Admitting: Podiatry

## 2019-12-13 ENCOUNTER — Ambulatory Visit (INDEPENDENT_AMBULATORY_CARE_PROVIDER_SITE_OTHER): Payer: Managed Care, Other (non HMO)

## 2019-12-13 DIAGNOSIS — M722 Plantar fascial fibromatosis: Secondary | ICD-10-CM | POA: Diagnosis not present

## 2019-12-13 NOTE — Progress Notes (Signed)
  Subjective:  Patient ID: Elizabeth Donaldson, female    DOB: 1980/10/28,  MRN: [id number]  Chief Complaint  Patient presents with  . Foot Pain    B/L-  heel pain- burning stabbing throbbing pain in both heel- pt wears steel toe shoes almost all day at work and stand for long period of time     39 y.o. female presents with the above complaint.  Patient presents with complaint bilateral heel pain that has been going on for quite some time.  Patient wears 2 toe boots at work and has been standing for long period time.  Patient works night shift.  She experiences post static dyskinesia type of symptoms.  She denies any other acute complaints.  She would like to discuss treatment options.  She has not seen anyone else prior to seeing me.  Her pain scale 7 out of 10 dull achy in nature.   Review of Systems: Negative except as noted in the HPI. Denies N/V/F/Ch.  Past Medical History:  Diagnosis Date  . Labial abscess     Current Outpatient Medications:  .  carbamide peroxide (DEBROX) 6.5 % otic solution, 5 drops each ear every other Saturday starting October 20., Disp: 15 mL, Rfl: 3 .  cyclobenzaprine (FLEXERIL) 10 MG tablet, Take 1 tablet (10 mg total) by mouth 2 (two) times daily as needed for muscle spasms., Disp: 20 tablet, Rfl: 0 .  diclofenac (VOLTAREN) 75 MG EC tablet, Take 1 tablet (75 mg total) by mouth 2 (two) times daily., Disp: 20 tablet, Rfl: 0 .  Prenatal Vit-Fe Fumarate-FA (PRENATAL MULTIVITAMIN) TABS, Take 1 tablet by mouth daily at 12 noon., Disp: , Rfl:   Social History   Tobacco Use  Smoking Status Never Smoker  Smokeless Tobacco Never Used    No Known Allergies Objective:  There were no vitals filed for this visit. There is no height or weight on file to calculate BMI. Constitutional Well developed. Well nourished.  Vascular Dorsalis pedis pulses palpable bilaterally. Posterior tibial pulses palpable bilaterally. Capillary refill normal to all digits.  No cyanosis  or clubbing noted. Pedal hair growth normal.  Neurologic Normal speech. Oriented to person, place, and time. Epicritic sensation to light touch grossly present bilaterally.  Dermatologic Nails well groomed and normal in appearance. No open wounds. No skin lesions.  Orthopedic: Normal joint ROM without pain or crepitus bilaterally. No visible deformities. Tender to palpation at the calcaneal tuber bilaterally. No pain with calcaneal squeeze bilaterally. Ankle ROM full range of motion bilaterally. Silfverskiold Test: negative bilaterally.   Radiographs: Taken and reviewed. No acute fractures or dislocations. No evidence of stress fracture.  Plantar heel spur absent. Posterior heel spur absent.   Assessment:   1. Plantar fasciitis of right foot   2. Plantar fasciitis of left foot    Plan:  Patient was evaluated and treated and all questions answered.  Plantar Fasciitis, bilaterally - XR reviewed as above.  - Educated on icing and stretching. Instructions given.  - Injection delivered to the plantar fascia as below. - DME: Plantar Fascial Brace x2 - Pharmacologic management: None  Procedure: Injection Tendon/Ligament Location: Bilateral plantar fascia at the glabrous junction; medial approach. Skin Prep: alcohol Injectate: 0.5 cc 0.5% marcaine plain, 0.5 cc of 1% Lidocaine, 0.5 cc kenalog 10. Disposition: Patient tolerated procedure well. Injection site dressed with a band-aid.  No follow-ups on file.

## 2020-01-05 ENCOUNTER — Other Ambulatory Visit: Payer: Self-pay

## 2020-01-05 ENCOUNTER — Encounter: Payer: Self-pay | Admitting: *Deleted

## 2020-01-05 ENCOUNTER — Ambulatory Visit
Admission: EM | Admit: 2020-01-05 | Discharge: 2020-01-05 | Disposition: A | Discharge status: Home / Self Care | Payer: Managed Care, Other (non HMO) | Attending: Physician Assistant | Admitting: Physician Assistant

## 2020-01-05 DIAGNOSIS — M79662 Pain in left lower leg: Secondary | ICD-10-CM

## 2020-01-05 MED ORDER — MELOXICAM 7.5 MG PO TABS: 7.5000 mg | ORAL_TABLET | Freq: Every day | ORAL | 0 refills | Status: AC

## 2020-01-05 NOTE — Discharge Instructions (Signed)
Start Mobic. Do not take ibuprofen (motrin/advil)/ naproxen (aleve) while on mobic. Can use over the counter voltaren gel as well. Ice compress. Follow up with PCP if symptoms not improving.

## 2020-01-05 NOTE — ED Provider Notes (Signed)
[facility]    CSN: [id number] Arrival date & time: 01/05/20  0943      History   Chief Complaint Chief Complaint  Patient presents with   Leg Pain    HPI Elizabeth Donaldson is a 39 y.o. female.   39 year old female comes in for pain to the left anterior leg starting yesterday. Noticed area is tender, and felt a "knot". Denies obvious injury/trauma. Denies erythema, warmth. Denies increase in activity.      Past Medical History:  Diagnosis Date   Labial abscess     Patient Active Problem List   Diagnosis Date Noted   NSVD (normal spontaneous vaginal delivery) 04/03/2013   Obesity, unspecified 04/02/2013   History of pregnancy induced hypertension 04/02/2013   Known or suspected fetal anomaly, antepartum 04/02/2013    Past Surgical History:  Procedure Laterality Date   TUBAL LIGATION Bilateral 04/03/2013   Procedure: POST PARTUM TUBAL LIGATION;  Surgeon: Michael Litter, MD;  Location: [facility];  Service: Gynecology;  Laterality: Bilateral;    OB History    Gravida  2   Para  2   Term  2   Preterm      AB      Living  2     SAB      TAB      Ectopic      Multiple      Live Births  2            Home Medications    Prior to Admission medications   Medication Sig Start Date End Date Taking? Authorizing Provider  meloxicam (MOBIC) 7.5 MG tablet Take 1 tablet (7.5 mg total) by mouth daily. 01/05/20   Belinda Fisher, PA-C    Family History Family History  Problem Relation Age of Onset   Hypertension Mother    Hypertension Father     Social History Social History   Tobacco Use   Smoking status: Never Smoker   Smokeless tobacco: Never Used  Building services engineer Use: Never used  Substance Use Topics   Alcohol use: No   Drug use: No     Allergies   Patient has no known allergies.   Review of Systems Review of Systems  Reason unable to perform ROS: See HPI as above.     Physical Exam Triage Vital  Signs ED Triage Vitals  Enc Vitals Group     BP 01/05/20 1017 122/81     Pulse Rate 01/05/20 1017 83     Resp 01/05/20 1017 16     Temp 01/05/20 1017 98.6 F (37 C)     Temp Source 01/05/20 1017 Oral     SpO2 01/05/20 1017 100 %     Weight --      Height --      Head Circumference --      Peak Flow --      Pain Score 01/05/20 1018 0     Pain Loc --      Pain Edu? --      Excl. in GC? --    No data found.  Updated Vital Signs BP 122/81    Pulse 83    Temp 98.6 F (37 C) (Oral)    Resp 16    LMP 12/25/2019 (Approximate)    SpO2 100%    Breastfeeding No   Visual Acuity Right Eye Distance:   Left Eye Distance:   Bilateral Distance:  Right Eye Near:   Left Eye Near:    Bilateral Near:     Physical Exam Constitutional:      General: She is not in acute distress.    Appearance: Normal appearance. She is well-developed. She is not toxic-appearing or diaphoretic.  HENT:     Head: Normocephalic and atraumatic.  Eyes:     Conjunctiva/sclera: Conjunctivae normal.     Pupils: Pupils are equal, round, and reactive to light.  Pulmonary:     Effort: Pulmonary effort is normal. No respiratory distress.  Musculoskeletal:     Cervical back: Normal range of motion and neck supple.     Comments: No swelling, erythema, warmth, contusion. Tenderness to palpation of mid lateral shin. No induration/fluctuance. No masses felt. Soft compartments  Skin:    General: Skin is warm and dry.  Neurological:     Mental Status: She is alert and oriented to person, place, and time.      UC Treatments / Results  Labs (all labs ordered are listed, but only abnormal results are displayed) Labs Reviewed - No data to display  EKG   Radiology No results found.  Procedures Procedures (including critical care time)  Medications Ordered in UC Medications - No data to display  Initial Impression / Assessment and Plan / UC Course  I have reviewed the triage vital signs and the nursing  notes.  Pertinent labs & imaging results that were available during my care of the patient were reviewed by me and considered in my medical decision making (see chart for details).    Will trial NSAIDs, ice compress for pain. Otherwise follow up with PCP if symptoms not improving. Return precautions given.  Final Clinical Impressions(s) / UC Diagnoses   Final diagnoses:  Pain in left lower leg    ED Prescriptions    Medication Sig Dispense Auth. Provider   meloxicam (MOBIC) 7.5 MG tablet Take 1 tablet (7.5 mg total) by mouth daily. 10 tablet Belinda Fisher, PA-C     PDMP not reviewed this encounter.   Belinda Fisher, PA-C 01/05/20 1129

## 2020-01-05 NOTE — ED Triage Notes (Signed)
C/O intermittent "knot" to left anterior lower leg x approx 2 wks.  Area tender to palpation.  Denies any injury.

## 2020-01-16 ENCOUNTER — Ambulatory Visit (INDEPENDENT_AMBULATORY_CARE_PROVIDER_SITE_OTHER): Payer: Managed Care, Other (non HMO) | Admitting: Orthotics

## 2020-01-16 ENCOUNTER — Other Ambulatory Visit: Payer: Self-pay

## 2020-01-16 ENCOUNTER — Encounter: Payer: Self-pay | Admitting: Podiatry

## 2020-01-16 ENCOUNTER — Ambulatory Visit (INDEPENDENT_AMBULATORY_CARE_PROVIDER_SITE_OTHER): Payer: Managed Care, Other (non HMO) | Admitting: Podiatry

## 2020-01-16 DIAGNOSIS — M21969 Unspecified acquired deformity of unspecified lower leg: Secondary | ICD-10-CM

## 2020-01-16 DIAGNOSIS — M722 Plantar fascial fibromatosis: Secondary | ICD-10-CM

## 2020-01-16 DIAGNOSIS — M21961 Unspecified acquired deformity of right lower leg: Secondary | ICD-10-CM

## 2020-01-16 DIAGNOSIS — M21962 Unspecified acquired deformity of left lower leg: Secondary | ICD-10-CM | POA: Diagnosis not present

## 2020-01-16 NOTE — Progress Notes (Signed)

## 2020-01-16 NOTE — Progress Notes (Signed)
  Subjective:  Patient ID: Elizabeth Donaldson, female    DOB: March 22, 1981,  MRN: [id number]  Chief Complaint  Patient presents with  . Foot Pain    pt states she is still having pain in her right foot left foot feels better     39 y.o. female presents with the above complaint.  Patient presents with follow-up of bilateral plantar fasciitis.  Patient states that her pain is about the same she did get improvement with the injection for about 3 weeks and the pain started coming back.  The right side is worse than left side.  The brace is somewhat helping but not really.  She would like to discuss future treatment options.   Review of Systems: Negative except as noted in the HPI. Denies N/V/F/Ch.  Past Medical History:  Diagnosis Date  . Labial abscess     Current Outpatient Medications:  .  meloxicam (MOBIC) 7.5 MG tablet, Take 1 tablet (7.5 mg total) by mouth daily., Disp: 10 tablet, Rfl: 0  Social History   Tobacco Use  Smoking Status Never Smoker  Smokeless Tobacco Never Used    No Known Allergies Objective:  There were no vitals filed for this visit. There is no height or weight on file to calculate BMI. Constitutional Well developed. Well nourished.  Vascular Dorsalis pedis pulses palpable bilaterally. Posterior tibial pulses palpable bilaterally. Capillary refill normal to all digits.  No cyanosis or clubbing noted. Pedal hair growth normal.  Neurologic Normal speech. Oriented to person, place, and time. Epicritic sensation to light touch grossly present bilaterally.  Dermatologic Nails well groomed and normal in appearance. No open wounds. No skin lesions.  Orthopedic: Normal joint ROM without pain or crepitus bilaterally. No visible deformities. Tender to palpation at the calcaneal tuber bilaterally. No pain with calcaneal squeeze bilaterally. Ankle ROM full range of motion bilaterally. Silfverskiold Test: negative bilaterally.   Radiographs: Taken and reviewed.  No acute fractures or dislocations. No evidence of stress fracture.  Plantar heel spur absent. Posterior heel spur absent.   Assessment:   1. Foot deformity, acquired, unspecified laterality   2. Plantar fasciitis of right foot   3. Plantar fasciitis of left foot    Plan:  Patient was evaluated and treated and all questions answered.  Plantar Fasciitis, bilaterally - XR reviewed as above.  - Educated on icing and stretching. Instructions given.  -Second injection delivered to the plantar fascia as below. - DME: Night splint x2 - Pharmacologic management: None  Pes planovalgus/other deformity of the foot -I explained patient the etiology of foot deformity and various treatment options were discussed.  I believe patient will benefit from custom-made orthotics to help control the hindfoot motion and support the arch of the foot.  Patient agrees with the plan would like to proceed with custom-made orthotics.  Procedure: Injection Tendon/Ligament Location: Bilateral plantar fascia at the glabrous junction; medial approach. Skin Prep: alcohol Injectate: 0.5 cc 0.5% marcaine plain, 0.5 cc of 1% Lidocaine, 0.5 cc kenalog 10. Disposition: Patient tolerated procedure well. Injection site dressed with a band-aid.  No follow-ups on file.

## 2020-02-13 ENCOUNTER — Ambulatory Visit (INDEPENDENT_AMBULATORY_CARE_PROVIDER_SITE_OTHER): Payer: Managed Care, Other (non HMO) | Admitting: Podiatry

## 2020-02-13 ENCOUNTER — Other Ambulatory Visit: Payer: Self-pay

## 2020-02-13 ENCOUNTER — Ambulatory Visit: Payer: Managed Care, Other (non HMO) | Admitting: Orthotics

## 2020-02-13 DIAGNOSIS — M21969 Unspecified acquired deformity of unspecified lower leg: Secondary | ICD-10-CM

## 2020-02-13 DIAGNOSIS — M722 Plantar fascial fibromatosis: Secondary | ICD-10-CM

## 2020-02-13 NOTE — Progress Notes (Signed)
Patient picked up f/o and was pleased with fit, comfort, and function.  Worked well with footwear.  Told of rbeak in period and how to report any issues.  

## 2020-02-19 ENCOUNTER — Encounter: Payer: Self-pay | Admitting: Podiatry

## 2020-02-19 NOTE — Progress Notes (Signed)
Subjective:  Patient ID: Elizabeth Donaldson, female    DOB: 05/14/1980,  MRN: [id number]  Chief Complaint  Patient presents with  . Foot Pain    Bilateral foot pain. Pt stated that she is doing better, she has no concerns     39 y.o. female presents with the above complaint.  Patient presents with follow-up of bilateral plantar fasciitis.  Patient states she is doing a lot better.  She states that she has been wearing orthotics.  She denies any other acute complaints.   Review of Systems: Negative except as noted in the HPI. Denies N/V/F/Ch.  Past Medical History:  Diagnosis Date  . Labial abscess     Current Outpatient Medications:  .  Cholecalciferol 50 MCG (2000 UT) CAPS, cholecalciferol (vitamin D3) 50 mcg (2,000 unit) capsule  TAKE 1 CAPSULE BY ORAL ROUTE DAILY FOR 30 DAYS, Disp: , Rfl:  .  ergocalciferol (VITAMIN D2) 1.25 MG (50000 UT) capsule, Vitamin D2 1,250 mcg (50,000 unit) capsule  TAKE 1 CAPSULE (50,000 UNIT) BY ORAL ROUTE ONCE WEEKLY, Disp: , Rfl:  .  hydrochlorothiazide (HYDRODIURIL) 12.5 MG tablet, Take 12.5 mg by mouth daily., Disp: , Rfl:  .  ibuprofen (ADVIL) 100 MG/5ML suspension, ibuprofen 100 mg/5 mL oral suspension  TAKE 30 MLS (600 MG TOTAL) BY MOUTH EVERY 6 (SIX) HOURS., Disp: , Rfl:  .  meloxicam (MOBIC) 7.5 MG tablet, Take 1 tablet (7.5 mg total) by mouth daily., Disp: 10 tablet, Rfl: 0 .  metroNIDAZOLE (FLAGYL) 500 MG tablet, metronidazole 500 mg tablet  TAKE 1 TABLET (500 MG TOTAL) BY MOUTH 2 (TWO) TIMES DAILY., Disp: , Rfl:  .  ondansetron (ZOFRAN) 8 MG tablet, ondansetron HCl 8 mg tablet  TAKE 1 TABLET(S) TWICE A DAY BY ORAL ROUTE AS NEEDED., Disp: , Rfl:   Social History   Tobacco Use  Smoking Status Never Smoker  Smokeless Tobacco Never Used    No Known Allergies Objective:  There were no vitals filed for this visit. There is no height or weight on file to calculate BMI. Constitutional Well developed. Well nourished.  Vascular Dorsalis pedis  pulses palpable bilaterally. Posterior tibial pulses palpable bilaterally. Capillary refill normal to all digits.  No cyanosis or clubbing noted. Pedal hair growth normal.  Neurologic Normal speech. Oriented to person, place, and time. Epicritic sensation to light touch grossly present bilaterally.  Dermatologic Nails well groomed and normal in appearance. No open wounds. No skin lesions.  Orthopedic: Normal joint ROM without pain or crepitus bilaterally. No visible deformities. Mild tender to palpation at the calcaneal tuber bilaterally. No pain with calcaneal squeeze bilaterally. Ankle ROM full range of motion bilaterally. Silfverskiold Test: negative bilaterally.   Radiographs: Taken and reviewed. No acute fractures or dislocations. No evidence of stress fracture.  Plantar heel spur absent. Posterior heel spur absent.   Assessment:   1. Plantar fasciitis of left foot   2. Plantar fasciitis of right foot   3. Foot deformity, acquired, unspecified laterality    Plan:  Patient was evaluated and treated and all questions answered.  Plantar Fasciitis, bilaterally -Improved.  At this time I discussed with her the continued use of stretching as well as orthotics which shoe gear modification and the importance of preventing plantar fasciitis for long-term.  -I will see her back again in 4 weeks to reevaluate how her pain and orthotics are functioning.  Given that she still has residual pain I will clinical monitor for improvement.  Pes planovalgus/other deformity of the  foot -I explained patient the etiology of foot deformity and various treatment options were discussed.  I believe patient will benefit from custom-made orthotics to help control the hindfoot motion and support the arch of the foot.   -Patient has been wearing orthotics.  The orthotics are functioning well.  I encouraged her to continue wearing orthotics at all times.  Patient states understanding   No follow-ups on  file.

## 2020-02-28 ENCOUNTER — Other Ambulatory Visit: Payer: Self-pay | Admitting: Obstetrics and Gynecology

## 2020-02-28 DIAGNOSIS — E049 Nontoxic goiter, unspecified: Secondary | ICD-10-CM

## 2020-03-12 ENCOUNTER — Other Ambulatory Visit: Payer: Self-pay

## 2020-03-12 ENCOUNTER — Ambulatory Visit (INDEPENDENT_AMBULATORY_CARE_PROVIDER_SITE_OTHER): Payer: Managed Care, Other (non HMO) | Admitting: Podiatry

## 2020-03-12 DIAGNOSIS — M722 Plantar fascial fibromatosis: Secondary | ICD-10-CM | POA: Diagnosis not present

## 2020-03-13 ENCOUNTER — Encounter: Payer: Self-pay | Admitting: Podiatry

## 2020-03-13 NOTE — Progress Notes (Signed)
Subjective:  Patient ID: Elizabeth Donaldson, female    DOB: 09-05-1980,  MRN: [id number]  Chief Complaint  Patient presents with  . Plantar Fasciitis    Pt stated that she is having some pain still and she has no major concerns.    39 y.o. female presents with the above complaint.  Patient presents with follow-up of bilateral plantar fasciitis.  She states that she is doing better but she still has pain on and off especially right greater than left side.  She would like to know if there is anything else that could be done.  She denies any other acute complaints   Review of Systems: Negative except as noted in the HPI. Denies N/V/F/Ch.  Past Medical History:  Diagnosis Date  . Labial abscess     Current Outpatient Medications:  .  Cholecalciferol 50 MCG (2000 UT) CAPS, cholecalciferol (vitamin D3) 50 mcg (2,000 unit) capsule  TAKE 1 CAPSULE BY ORAL ROUTE DAILY FOR 30 DAYS, Disp: , Rfl:  .  ergocalciferol (VITAMIN D2) 1.25 MG (50000 UT) capsule, Vitamin D2 1,250 mcg (50,000 unit) capsule  TAKE 1 CAPSULE (50,000 UNIT) BY ORAL ROUTE ONCE WEEKLY, Disp: , Rfl:  .  hydrochlorothiazide (HYDRODIURIL) 12.5 MG tablet, Take 12.5 mg by mouth daily., Disp: , Rfl:  .  ibuprofen (ADVIL) 100 MG/5ML suspension, ibuprofen 100 mg/5 mL oral suspension  TAKE 30 MLS (600 MG TOTAL) BY MOUTH EVERY 6 (SIX) HOURS., Disp: , Rfl:  .  meloxicam (MOBIC) 7.5 MG tablet, Take 1 tablet (7.5 mg total) by mouth daily., Disp: 10 tablet, Rfl: 0 .  metroNIDAZOLE (FLAGYL) 500 MG tablet, metronidazole 500 mg tablet  TAKE 1 TABLET (500 MG TOTAL) BY MOUTH 2 (TWO) TIMES DAILY., Disp: , Rfl:  .  ondansetron (ZOFRAN) 8 MG tablet, ondansetron HCl 8 mg tablet  TAKE 1 TABLET(S) TWICE A DAY BY ORAL ROUTE AS NEEDED., Disp: , Rfl:   Social History   Tobacco Use  Smoking Status Never Smoker  Smokeless Tobacco Never Used    No Known Allergies Objective:  There were no vitals filed for this visit. There is no height or weight on file  to calculate BMI. Constitutional Well developed. Well nourished.  Vascular Dorsalis pedis pulses palpable bilaterally. Posterior tibial pulses palpable bilaterally. Capillary refill normal to all digits.  No cyanosis or clubbing noted. Pedal hair growth normal.  Neurologic Normal speech. Oriented to person, place, and time. Epicritic sensation to light touch grossly present bilaterally.  Dermatologic Nails well groomed and normal in appearance. No open wounds. No skin lesions.  Orthopedic: Normal joint ROM without pain or crepitus bilaterally. No visible deformities. Mild tender to palpation at the calcaneal tuber bilaterally. No pain with calcaneal squeeze bilaterally. Ankle ROM full range of motion bilaterally. Silfverskiold Test: negative bilaterally.   Radiographs: Taken and reviewed. No acute fractures or dislocations. No evidence of stress fracture.  Plantar heel spur absent. Posterior heel spur absent.   Assessment:   1. Plantar fasciitis of right foot    Plan:  Patient was evaluated and treated and all questions answered.  Plantar Fasciitis, bilaterally right greater than left side -Clinically patient has improved however the pain is on and off and right side is worse than left side.  At this time given that patient still having a good amount of pain I believe she will benefit from cam boot immobilization.  The orthotics are functioning well however they are not quite offloading the pain that she is having. -Cam boot  was dispensed -I will hold off on a steroid injection for now allow the immobilization to allow it to heal.  Patient states understanding -If there is no improvement we will discuss surgical treatment options.  Pes planovalgus/other deformity of the foot -I explained patient the etiology of foot deformity and various treatment options were discussed.  I believe patient will benefit from custom-made orthotics to help control the hindfoot motion and support the  arch of the foot.   -Patient has been wearing orthotics.  The orthotics are functioning well.  I encouraged her to continue wearing orthotics at all times.  Patient states understanding   No follow-ups on file.

## 2020-03-26 ENCOUNTER — Other Ambulatory Visit: Payer: Managed Care, Other (non HMO)

## 2020-03-28 ENCOUNTER — Other Ambulatory Visit: Payer: Managed Care, Other (non HMO)

## 2020-04-09 ENCOUNTER — Ambulatory Visit: Payer: Managed Care, Other (non HMO) | Admitting: Podiatry

## 2020-04-14 ENCOUNTER — Other Ambulatory Visit: Payer: Self-pay

## 2020-04-14 ENCOUNTER — Ambulatory Visit
Admission: RE | Admit: 2020-04-14 | Discharge: 2020-04-14 | Disposition: A | Discharge status: Home / Self Care | Payer: Managed Care, Other (non HMO) | Source: Ambulatory Visit | Attending: Obstetrics and Gynecology | Admitting: Obstetrics and Gynecology

## 2020-04-14 DIAGNOSIS — E049 Nontoxic goiter, unspecified: Secondary | ICD-10-CM

## 2021-02-16 ENCOUNTER — Ambulatory Visit
Admission: EM | Admit: 2021-02-16 | Discharge: 2021-02-16 | Disposition: A | Discharge status: Home / Self Care | Payer: Managed Care, Other (non HMO) | Attending: Internal Medicine | Admitting: Internal Medicine

## 2021-02-16 ENCOUNTER — Encounter: Payer: Self-pay | Admitting: Emergency Medicine

## 2021-02-16 DIAGNOSIS — R519 Headache, unspecified: Secondary | ICD-10-CM

## 2021-02-16 MED ORDER — KETOROLAC TROMETHAMINE 30 MG/ML IJ SOLN
30.0000 mg | Freq: Once | INTRAMUSCULAR | Status: AC
  Administered 2021-02-16: 18:00:00 30 mg via INTRAMUSCULAR

## 2021-02-16 NOTE — ED Triage Notes (Signed)
Headaches and hot flashes x 3 days. Wanted her BP checked. BP in triage 123/82

## 2021-02-16 NOTE — Discharge Instructions (Signed)
You were given a medication to help alleviate headache in urgent care today.  If no improvement in pain in the next 24 hours, please go the hospital for further evaluation and management.  Please also check blood pressure with home blood pressure cuff routinely and follow-up with PCP if blood pressure is elevated.  Go to the hospital if blood pressure is significantly elevated.  COVID-19 and flu test is pending.  We will call if it is positive.

## 2021-02-16 NOTE — ED Provider Notes (Signed)
[facility]    CSN: [id number] Arrival date & time: 02/16/21  1547      History   Chief Complaint Chief Complaint  Patient presents with   Headache    HPI Elizabeth Donaldson is a 40 y.o. female.   Patient presents with intermittent headaches and intermittent hot flashes that have been present for approximately 3 days.  Patient came to urgent care today due to concerns for high blood pressure causing symptoms as she has had similar symptoms in the past with elevated blood pressure.  Patient used to take blood pressure medication that she cannot remember the name of, but stopped taking this a few months prior as she ran out of the medication and never returned to PCP for further evaluation and management.  Patient denies chest pain, shortness of breath, blurred vision, nausea, vomiting, dizziness.  Denies any trauma to the head.  Headache is present to the front of the head and is rated 7/10 on pain scale at this time.  Denies history of migraines.  Denies any upper respiratory symptoms, sick contacts, fever.   Headache  Past Medical History:  Diagnosis Date   Labial abscess     Patient Active Problem List   Diagnosis Date Noted   NSVD (normal spontaneous vaginal delivery) 04/03/2013   Obesity, unspecified 04/02/2013   History of pregnancy induced hypertension 04/02/2013   Known or suspected fetal anomaly, antepartum 04/02/2013    Past Surgical History:  Procedure Laterality Date   TUBAL LIGATION Bilateral 04/03/2013   Procedure: POST PARTUM TUBAL LIGATION;  Surgeon: Michael Litter, MD;  Location: [facility];  Service: Gynecology;  Laterality: Bilateral;    OB History     Gravida  2   Para  2   Term  2   Preterm      AB      Living  2      SAB      IAB      Ectopic      Multiple      Live Births  2            Home Medications    Prior to Admission medications   Medication Sig Start Date End Date Taking? Authorizing Provider   Cholecalciferol 50 MCG (2000 UT) CAPS cholecalciferol (vitamin D3) 50 mcg (2,000 unit) capsule  TAKE 1 CAPSULE BY ORAL ROUTE DAILY FOR 30 DAYS    [provider]  ergocalciferol (VITAMIN D2) 1.25 MG (50000 UT) capsule Vitamin D2 1,250 mcg (50,000 unit) capsule  TAKE 1 CAPSULE (50,000 UNIT) BY ORAL ROUTE ONCE WEEKLY    [provider]  hydrochlorothiazide (HYDRODIURIL) 12.5 MG tablet Take 12.5 mg by mouth daily. 09/01/19   [provider]  ibuprofen (ADVIL) 100 MG/5ML suspension ibuprofen 100 mg/5 mL oral suspension  TAKE 30 MLS (600 MG TOTAL) BY MOUTH EVERY 6 (SIX) HOURS.    [provider]  meloxicam (MOBIC) 7.5 MG tablet Take 1 tablet (7.5 mg total) by mouth daily. 01/05/20   Cathie Hoops, Amy V, PA-C  metroNIDAZOLE (FLAGYL) 500 MG tablet metronidazole 500 mg tablet  TAKE 1 TABLET (500 MG TOTAL) BY MOUTH 2 (TWO) TIMES DAILY.    [provider]  ondansetron (ZOFRAN) 8 MG tablet ondansetron HCl 8 mg tablet  TAKE 1 TABLET(S) TWICE A DAY BY ORAL ROUTE AS NEEDED.    [provider]    Family History Family History  Problem Relation Age of Onset   Hypertension Mother  Hypertension Father     Social History Social History   Tobacco Use   Smoking status: Never   Smokeless tobacco: Never  Vaping Use   Vaping Use: Never used  Substance Use Topics   Alcohol use: No   Drug use: No     Allergies   Patient has no known allergies.   Review of Systems Review of Systems Per HPI  Physical Exam Triage Vital Signs ED Triage Vitals  Enc Vitals Group     BP 02/16/21 1704 123/82     Pulse Rate 02/16/21 1704 89     Resp 02/16/21 1704 16     Temp 02/16/21 1704 98.3 F (36.8 C)     Temp Source 02/16/21 1704 Oral     SpO2 02/16/21 1704 99 %     Weight --      Height --      Head Circumference --      Peak Flow --      Pain Score 02/16/21 1705 5     Pain Loc --      Pain Edu? --      Excl. in GC? --    No data found.  Updated Vital  Signs BP 123/82 (BP Location: Left Arm)   Pulse 89   Temp 98.3 F (36.8 C) (Oral)   Resp 16   SpO2 99%   Visual Acuity Right Eye Distance:   Left Eye Distance:   Bilateral Distance:    Right Eye Near:   Left Eye Near:    Bilateral Near:     Physical Exam Constitutional:      General: She is not in acute distress.    Appearance: Normal appearance. She is not toxic-appearing or diaphoretic.  HENT:     Head: Normocephalic and atraumatic.     Right Ear: Tympanic membrane and ear canal normal.     Left Ear: Tympanic membrane and ear canal normal.     Mouth/Throat:     Mouth: Mucous membranes are moist.     Pharynx: No posterior oropharyngeal erythema.  Eyes:     Extraocular Movements: Extraocular movements intact.     Conjunctiva/sclera: Conjunctivae normal.     Pupils: Pupils are equal, round, and reactive to light.  Cardiovascular:     Rate and Rhythm: Normal rate and regular rhythm.     Pulses: Normal pulses.     Heart sounds: Normal heart sounds.  Pulmonary:     Effort: Pulmonary effort is normal. No respiratory distress.     Breath sounds: Normal breath sounds.  Skin:    General: Skin is warm and dry.  Neurological:     General: No focal deficit present.     Mental Status: She is alert and oriented to person, place, and time. Mental status is at baseline.     Cranial Nerves: Cranial nerves 2-12 are intact.     Sensory: Sensation is intact.     Motor: Motor function is intact.     Coordination: Coordination is intact.     Gait: Gait is intact.  Psychiatric:        Mood and Affect: Mood normal.        Behavior: Behavior normal.        Thought Content: Thought content normal.        Judgment: Judgment normal.     UC Treatments / Results  Labs (all labs ordered are listed, but only abnormal results are displayed) Labs Reviewed  COVID-19, FLU A+B  NAA    EKG   Radiology No results found.  Procedures Procedures (including critical care  time)  Medications Ordered in UC Medications  ketorolac (TORADOL) 30 MG/ML injection 30 mg (30 mg Intramuscular Given 02/16/21 1757)    Initial Impression / Assessment and Plan / UC Course  I have reviewed the triage vital signs and the nursing notes.  Pertinent labs & imaging results that were available during my care of the patient were reviewed by me and considered in my medical decision making (see chart for details).     Vital signs, specifically blood pressure is stable and normal in urgent care today.  Neuro exam is normal.  Suspect patient could have typical migraine or viral illness that is causing migraine and hot flashes.  Ketorolac administered in urgent care to help alleviate headache.  COVID-19 and flu test pending to rule out viral cause of symptoms.  Advised patient to go to the hospital if no improvement in headache in the next 24 hours.  Also advised patient to monitor blood pressure with home cuff.  No red flags on exam, vital signs stable, neuro exam normal so patient is safe for discharge.  Discussed strict return precautions.  Patient verbalized understanding and was agreeable with plan. Final Clinical Impressions(s) / UC Diagnoses   Final diagnoses:  Acute nonintractable headache, unspecified headache type     Discharge Instructions      You were given a medication to help alleviate headache in urgent care today.  If no improvement in pain in the next 24 hours, please go the hospital for further evaluation and management.  Please also check blood pressure with home blood pressure cuff routinely and follow-up with PCP if blood pressure is elevated.  Go to the hospital if blood pressure is significantly elevated.  COVID-19 and flu test is pending.  We will call if it is positive.    ED Prescriptions   None    PDMP not reviewed this encounter.   Gustavus Bryant, [place] 02/16/21 1807

## 2021-02-17 LAB — COVID-19, FLU A+B NAA
Influenza A, NAA: NOT DETECTED
Influenza B, NAA: NOT DETECTED
SARS-CoV-2, NAA: NOT DETECTED

## 2021-02-23 IMAGING — US US THYROID
1 series · 13 of 25 positions shown · non-contrast
Comparison: None.

CLINICAL DATA: Nontoxic goiter palpated on physical examination

EXAM:
THYROID ULTRASOUND
TECHNIQUE: Ultrasound examination of the thyroid gland and adjacent soft
tissues was performed.

[Series 1: us thyroid · 0.06mm/px · 13 of 66 slices shown]
[im 1/66]
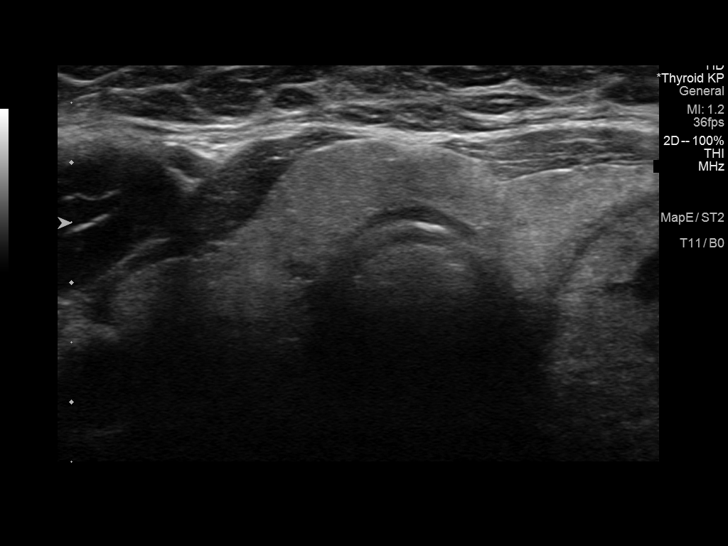
[im 6/66]
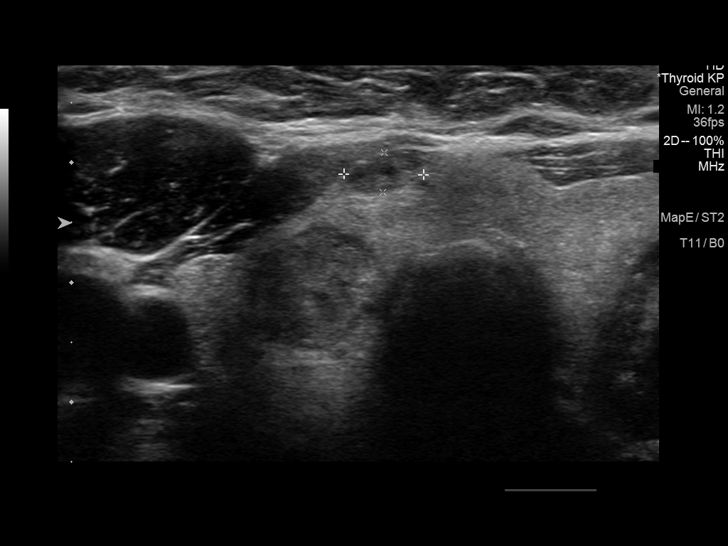
[im 11/66]
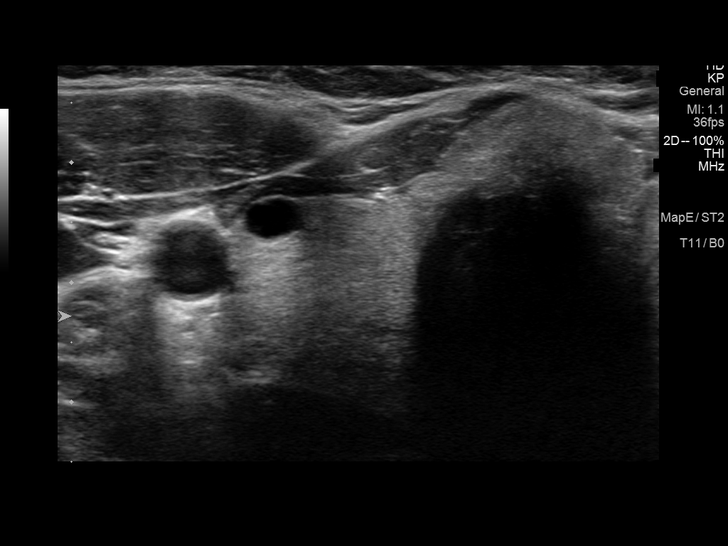
[im 17/66]
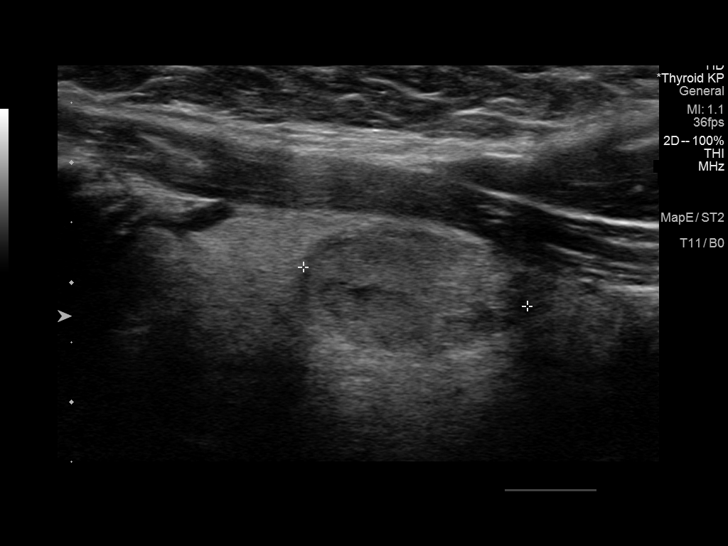
[im 22/66]
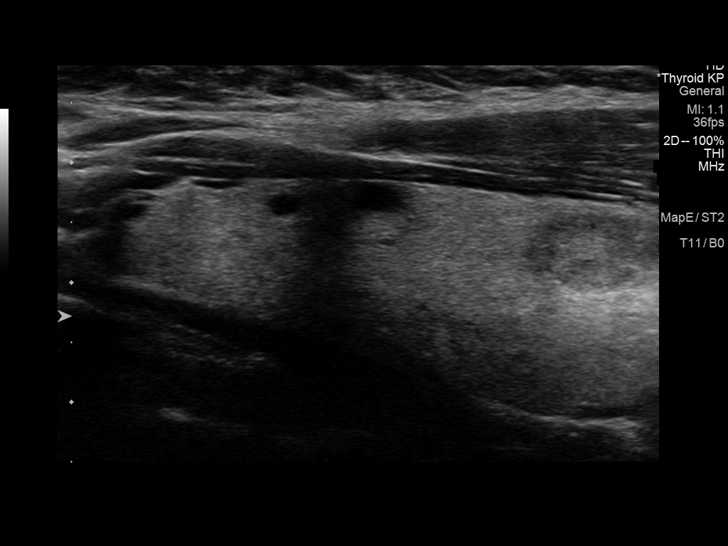
[im 28/66]
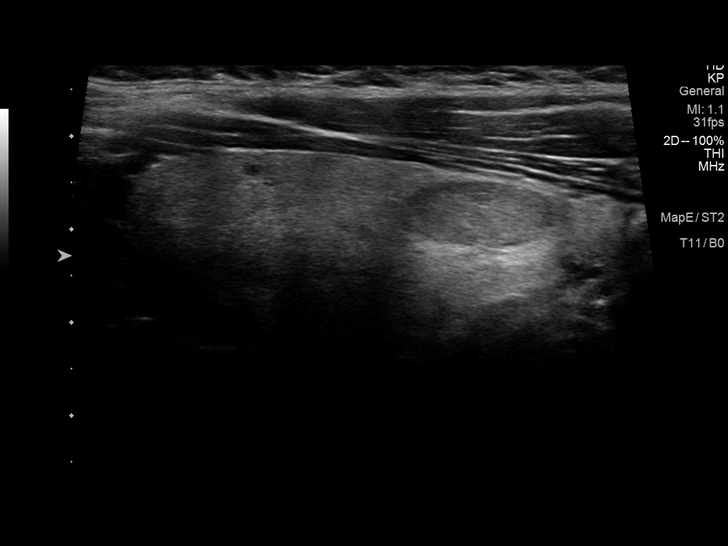
[im 33/66]
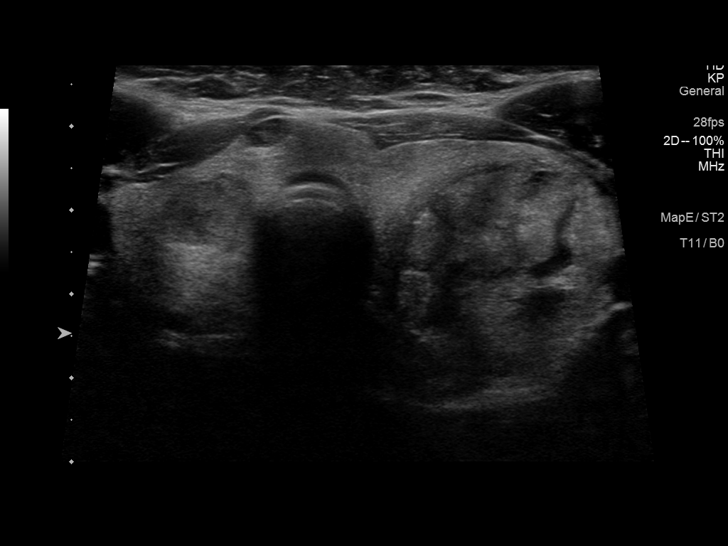
[im 38/66]
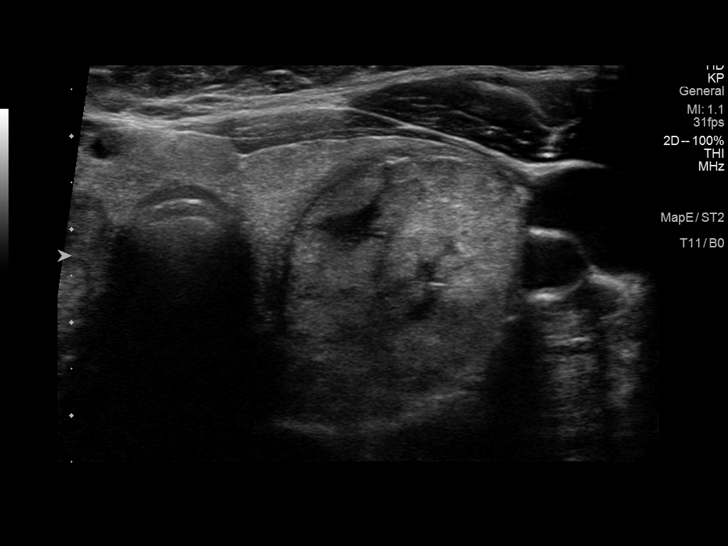
[im 44/66]
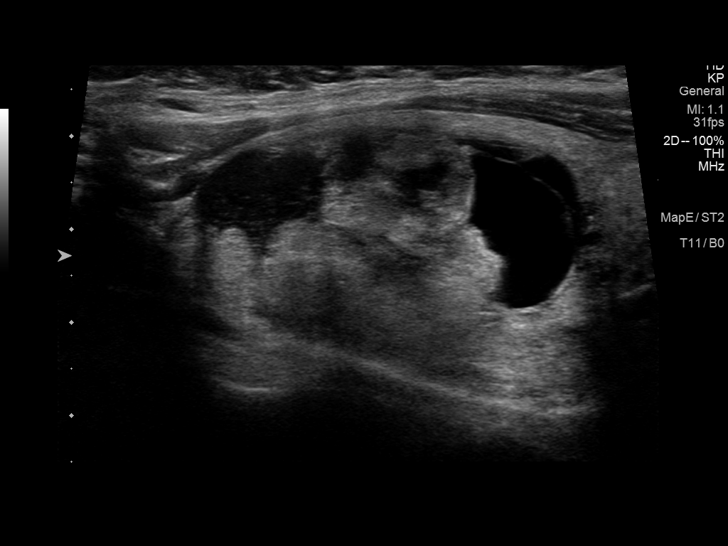
[im 49/66]
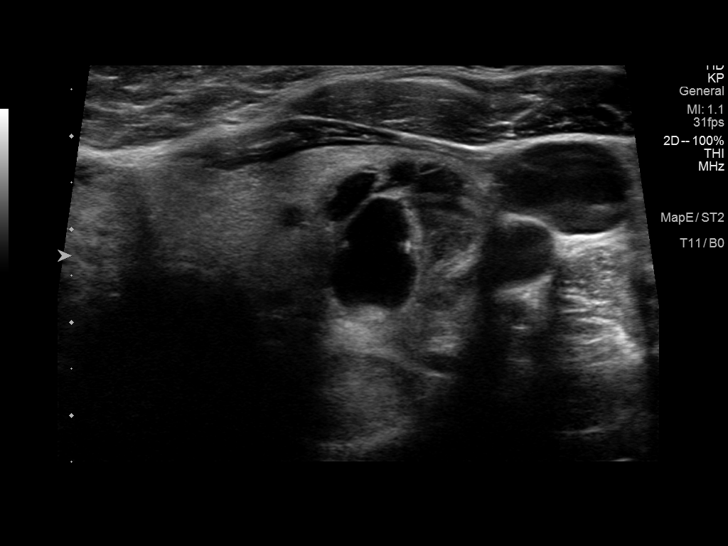
[im 55/66]
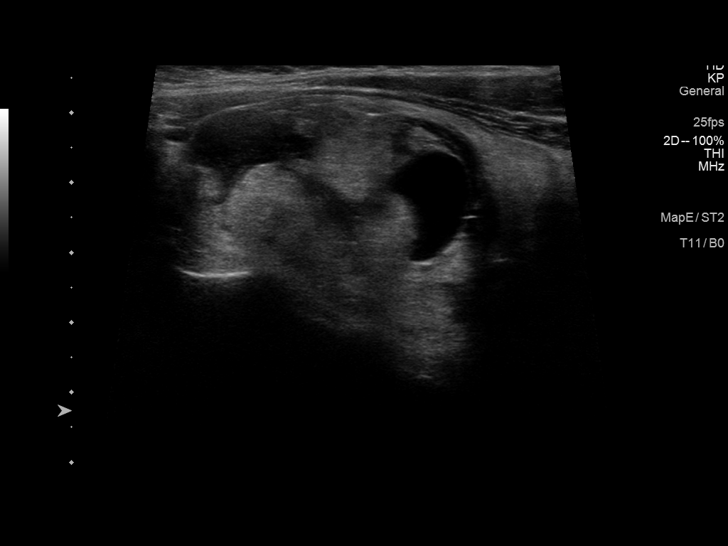
[im 60/66]
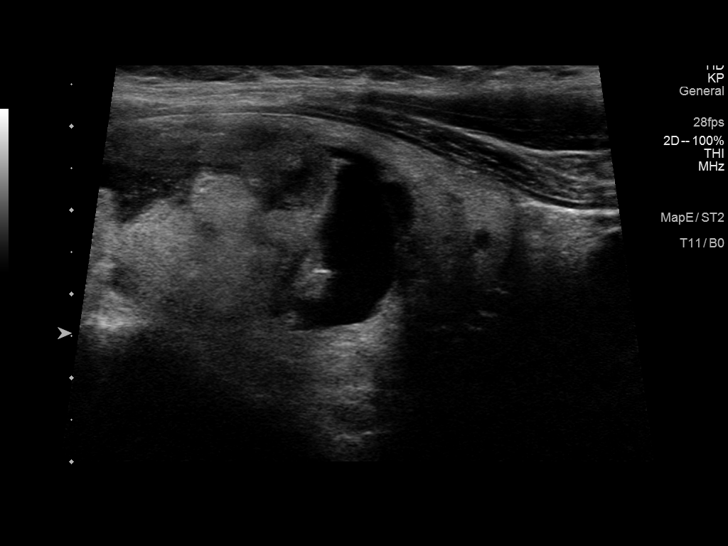
[im 66/66]
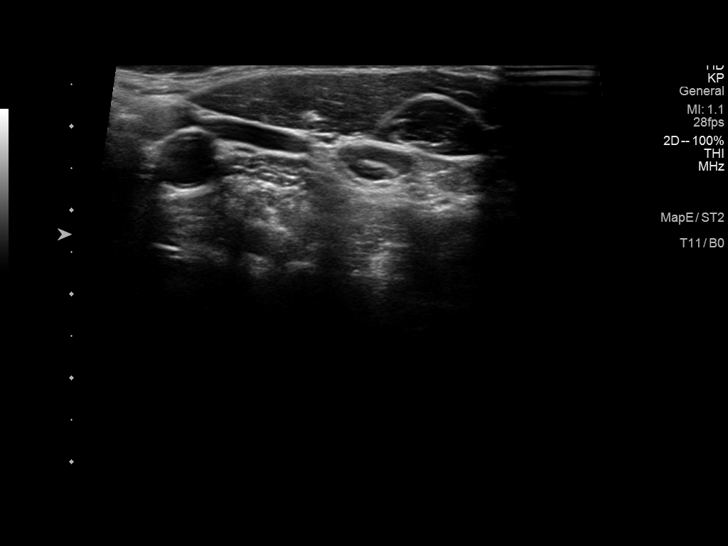

[13 of 25 positions shown; findings below may reference images not displayed]

FINDINGS: Parenchymal Echotexture: Mildly heterogeneous

Isthmus: 0.7 cm

Right lobe: 5.6 x 1.8 x 1.6 cm

Left lobe: 5.5 x 3.3 x 3.0 cm

_________________________________________________________

Estimated total number of nodules >/= 1 cm: 3

Number of spongiform nodules >/=  2 cm not described below (TR1): 0

Number of mixed cystic and solid nodules >/= 1.5 cm not described
below (TR2): 0

_________________________________________________________

Nodule # 1:

Location: Isthmus; inferior

Maximum size: 1.1 cm; Other 2 dimensions: 0.7 x 0.3 cm

Composition: solid/almost completely solid (2)

Echogenicity: hypoechoic (2)

Shape: not taller-than-wide (0)

Margins: smooth (0)

Echogenic foci: none (0)

ACR TI-RADS total points: 4.

ACR TI-RADS risk category: TR4 (4-6 points).

ACR TI-RADS recommendations:

*Given size (>/= 1 - 1.4 cm) and appearance, a follow-up ultrasound
in 1 year should be considered based on TI-RADS criteria.

_________________________________________________________

Nodule # 2:

Location: Right; inferior

Maximum size: 1.9 cm; Other 2 dimensions: 1.4 x 0.9 cm

Composition: solid/almost completely solid (2)

Echogenicity: hypoechoic (2)

Shape: not taller-than-wide (0)

Margins: smooth (0)

Echogenic foci: none (0)

ACR TI-RADS total points: 4.

ACR TI-RADS risk category: TR4 (4-6 points).

ACR TI-RADS recommendations:

**Given size (>/= 1.5 cm) and appearance, fine needle aspiration of
this moderately suspicious nodule should be considered based on
TI-RADS criteria.

_________________________________________________________

Nodule # 3:

Location: Left; superior

Maximum size: 4.2 cm; Other 2 dimensions: 2.6 x 2.6 cm

Composition: solid/almost completely solid (2)

Echogenicity: isoechoic (1)

Shape: not taller-than-wide (0)

Margins: smooth (0)

Echogenic foci: none (0)

ACR TI-RADS total points: 3.

ACR TI-RADS risk category: TR3 (3 points).

ACR TI-RADS recommendations:

**Given size (>/= 2.5 cm) and appearance, fine needle aspiration of
this mildly suspicious nodule should be considered based on TI-RADS
criteria.

_________________________________________________________
IMPRESSION: 1. Nodule 3 (TI-RADS 3) located in the superior left thyroid lobe,
measuring 4.2 x 2.6 x 2.6 cm, meets criteria for FNA.
2. Nodule 2 (TI-RADS 4) located in the inferior right thyroid lobe,
measuring 1.9 x 1.4 x 0.9 cm, meets criteria for FNA.
3. Nodule 1 (TI-RADS 4) located in the isthmus, measuring 1.1 x
x 0.3 cm, meets criteria for ultrasound follow-up with next exam
recommended in 1 year.

The above is in keeping with the ACR TI-RADS recommendations - [HOSPITAL] 0771;[DATE].

## 2021-05-04 ENCOUNTER — Other Ambulatory Visit: Payer: Self-pay | Admitting: Otolaryngology

## 2021-05-04 DIAGNOSIS — E041 Nontoxic single thyroid nodule: Secondary | ICD-10-CM

## 2021-05-27 ENCOUNTER — Other Ambulatory Visit (HOSPITAL_COMMUNITY)
Admission: RE | Admit: 2021-05-27 | Discharge: 2021-05-27 | Disposition: A | Discharge status: Home / Self Care | Payer: Managed Care, Other (non HMO) | Source: Ambulatory Visit | Attending: Otolaryngology | Admitting: Otolaryngology

## 2021-05-27 ENCOUNTER — Ambulatory Visit
Admission: RE | Admit: 2021-05-27 | Discharge: 2021-05-27 | Disposition: A | Discharge status: Home / Self Care | Payer: Managed Care, Other (non HMO) | Source: Ambulatory Visit | Attending: Otolaryngology | Admitting: Otolaryngology

## 2021-05-27 DIAGNOSIS — E042 Nontoxic multinodular goiter: Secondary | ICD-10-CM | POA: Diagnosis present

## 2021-05-27 DIAGNOSIS — E041 Nontoxic single thyroid nodule: Secondary | ICD-10-CM

## 2021-05-28 LAB — CYTOLOGY - NON PAP

## 2021-05-29 LAB — CYTOLOGY - NON PAP

## 2022-04-07 IMAGING — US US FNA BIOPSY THYROID 1ST LESION
1 series · 13 of 25 positions shown · non-contrast
Comparison: US Thyroid 04/14/20

MEDICATIONS:
None

COMPLICATIONS:
None immediate.

INDICATION: Indeterminate thyroid nodules

EXAM:
ULTRASOUND GUIDED FINE NEEDLE ASPIRATION OF INDETERMINATE THYROID
NODULES
TECHNIQUE: Informed written consent was obtained from the patient after a
discussion of the risks, benefits and alternatives to treatment.
Questions regarding the procedure were encouraged and answered. A
timeout was performed prior to the initiation of the procedure.

[Series 1: us fna biopsy thyroid 1st lesion · 0.06mm/px · 26 acquisitions, 13 frames shown]
[im 1/26]
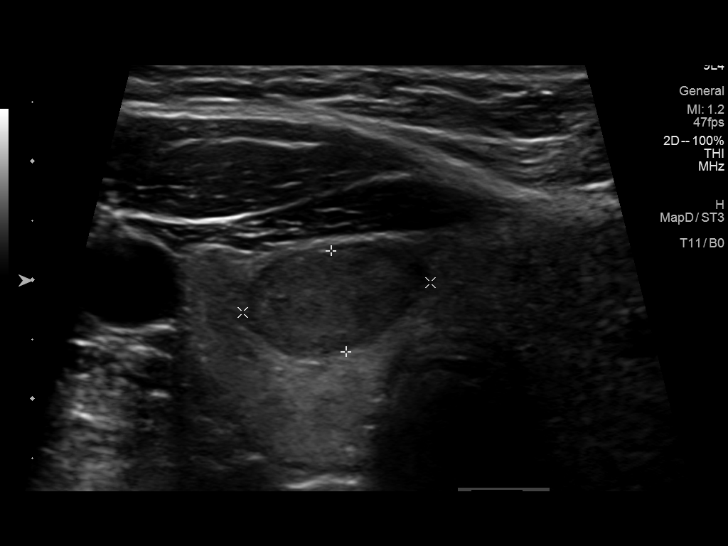
[im 3/26]
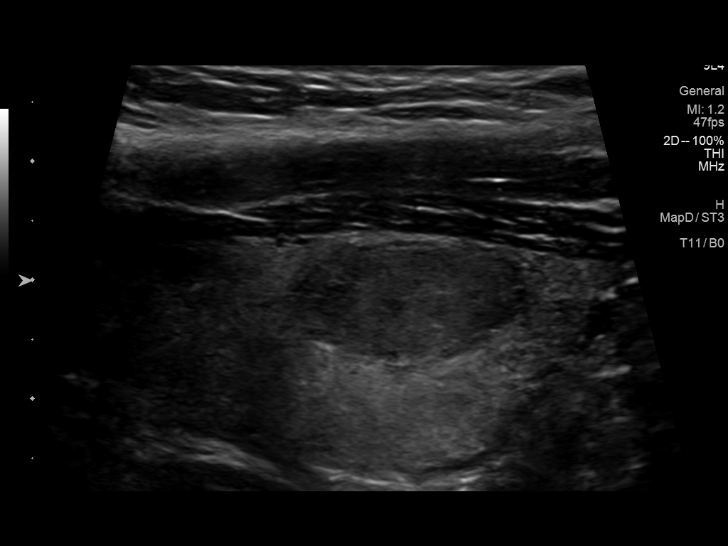
[im 5/26]
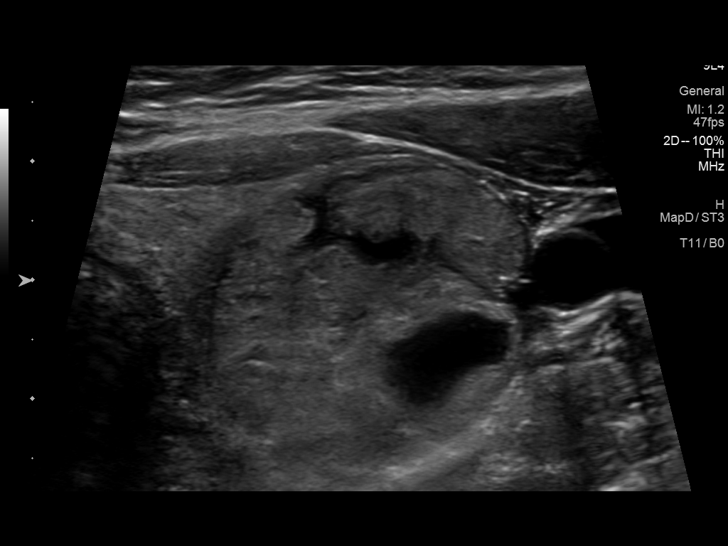
[im 7/26]
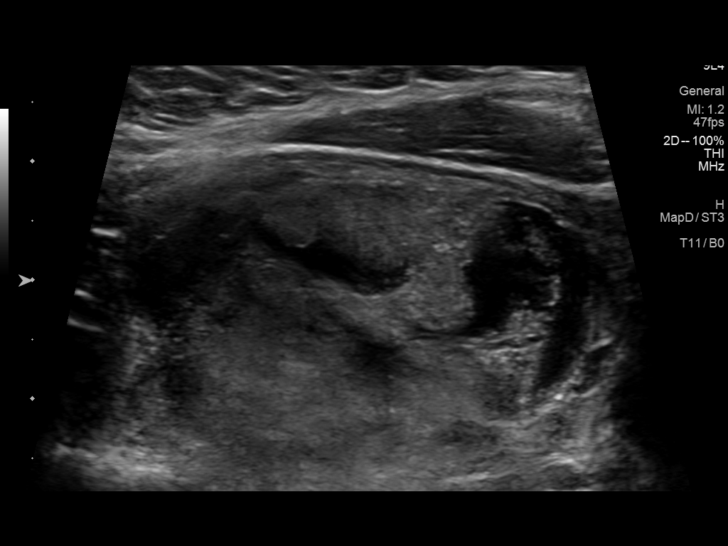
[im 9/26]
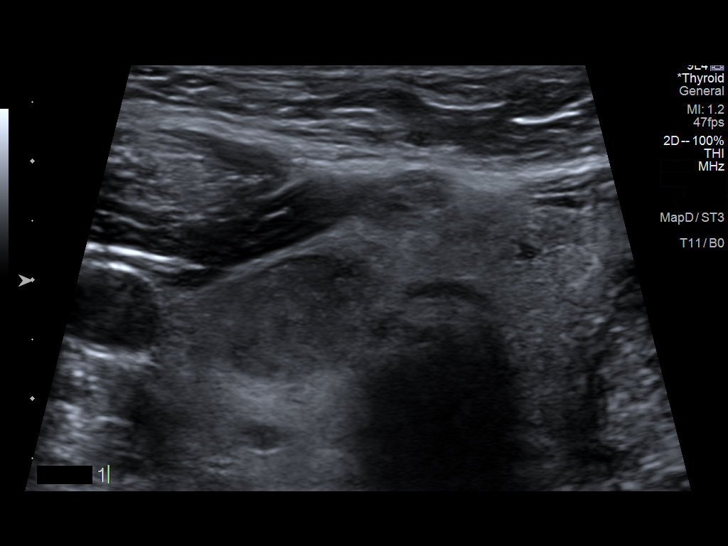
[im 11/26]
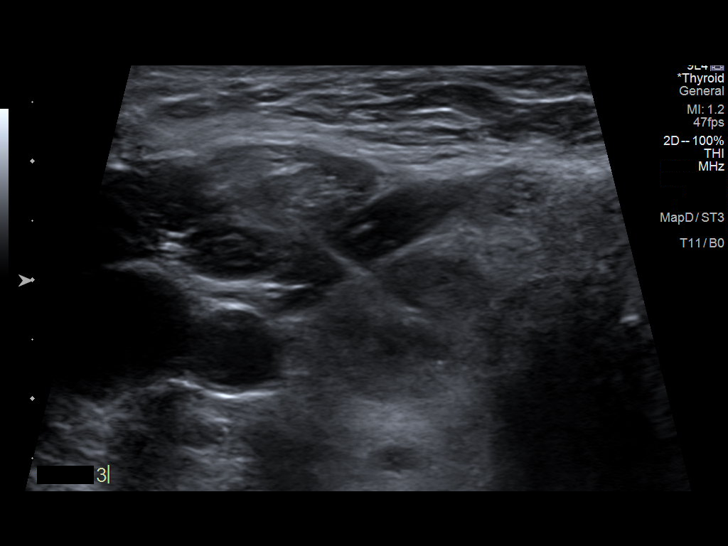
[im 13/26]
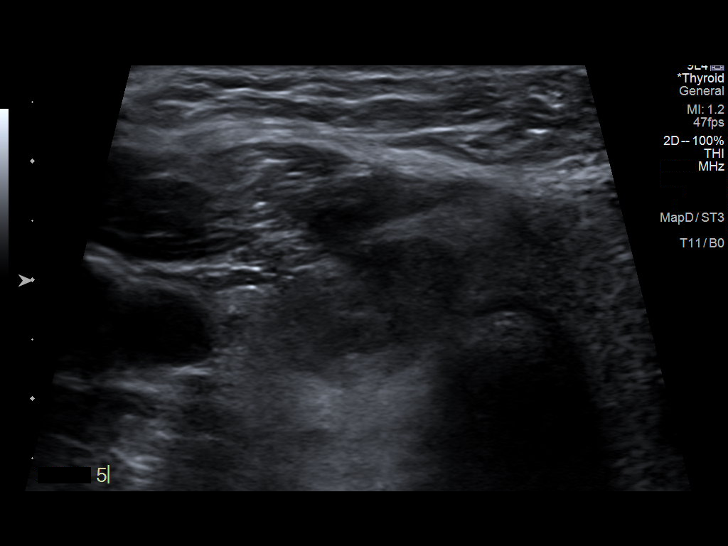
[im 15/26]
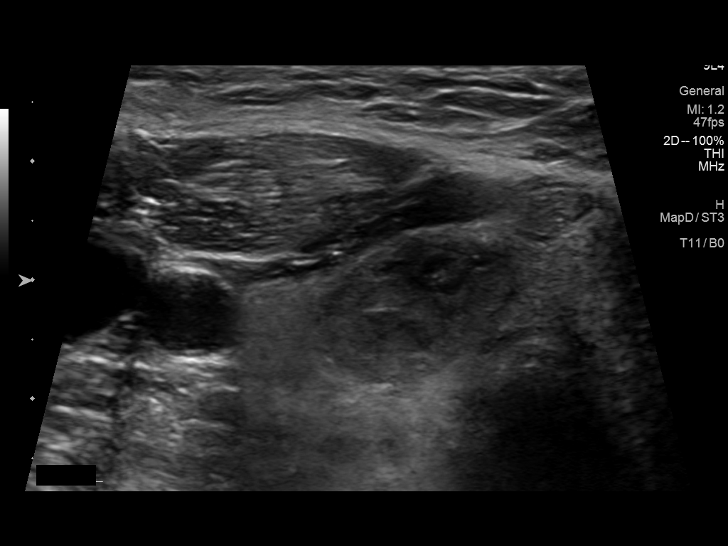
[im 17/26]
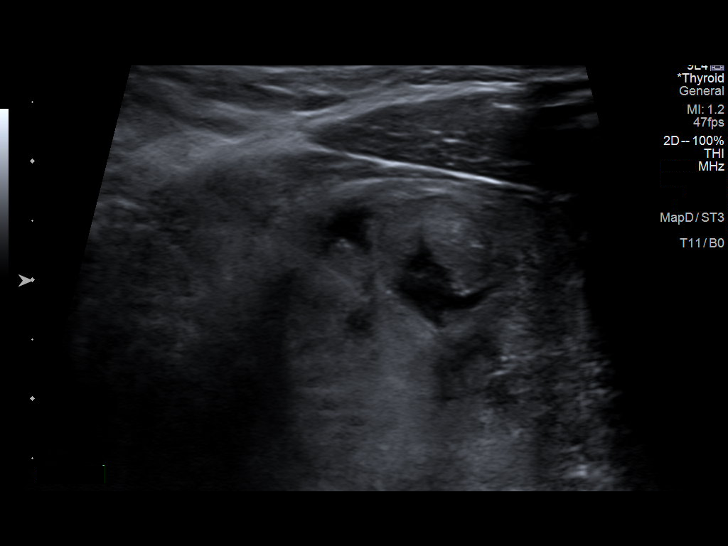
[im 19/26]
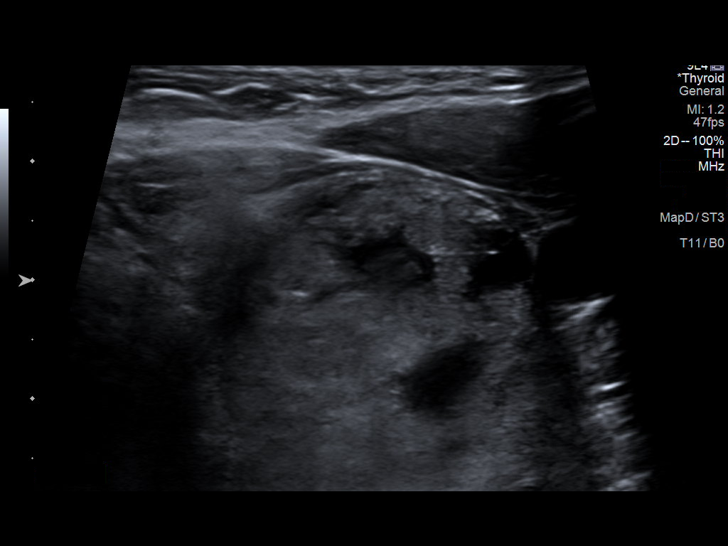
[im 21/26]
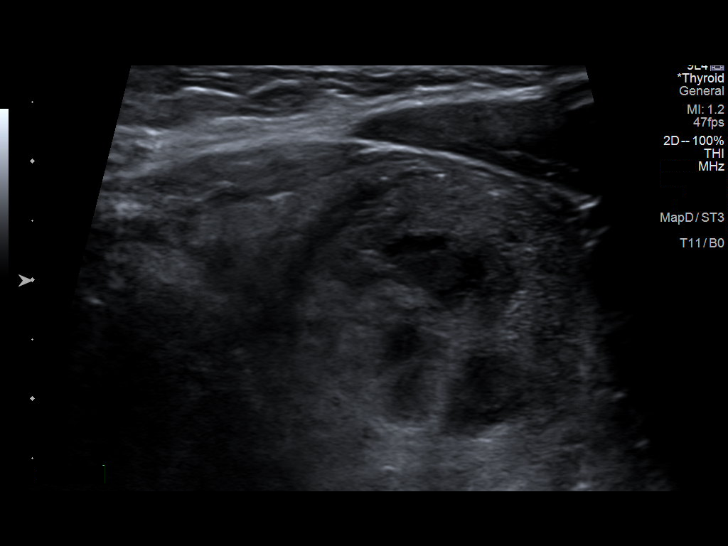
[im 23/26]
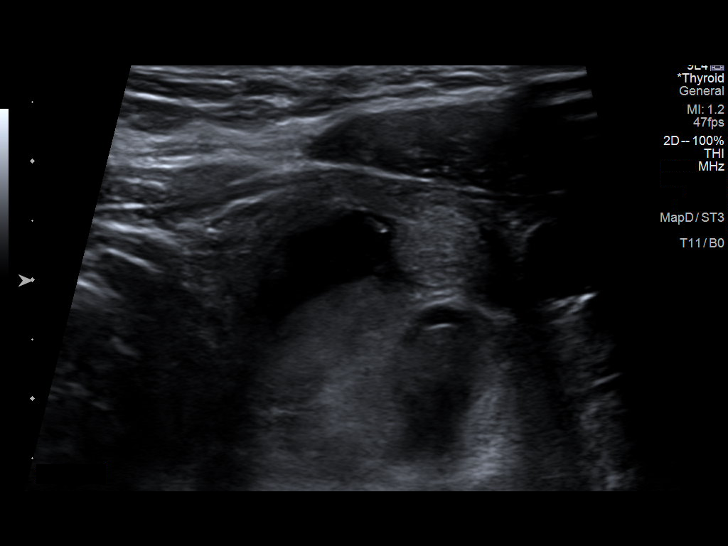
[im 26/26]
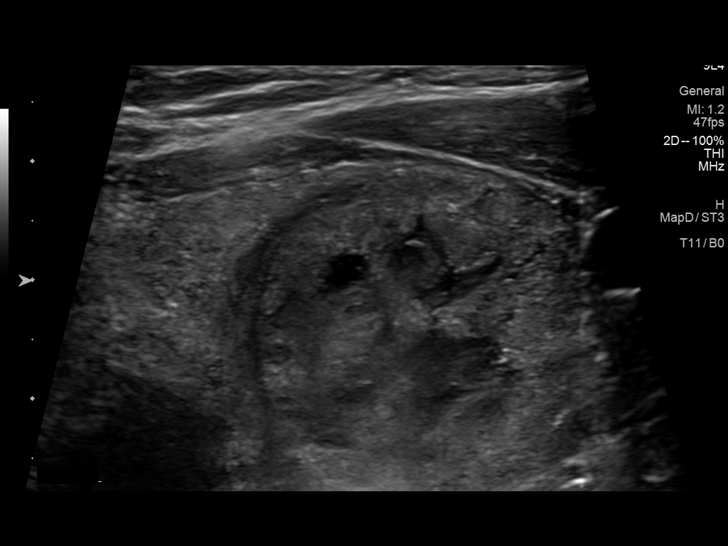

[13 of 25 positions shown; findings below may reference images not displayed]

Pre-procedural ultrasound scanning demonstrated unchanged size and
appearance of the indeterminate nodules within the right and left
lobes

The procedure was planned. The neck was prepped in the usual sterile
fashion, and a sterile drape was applied covering the operative
field. A timeout was performed prior to the initiation of the
procedure. Local anesthesia was provided with 1% lidocaine.

Under direct ultrasound guidance, 5 FNA biopsies were performed of
the right inferior lobe nodule with a 27 gauge needle. Multiple
ultrasound images were saved for procedural documentation purposes.
The samples were prepared and submitted to pathology. Two of these
specimens were reserved for Afirma testing.

Under direct ultrasound guidance, 5 FNA biopsies were performed of
the left superior lobe nodule with a 27 gauge needle. Multiple
ultrasound images were saved for procedural documentation purposes.
The samples were prepared and submitted to pathology. Two of these
specimens were reserved for Afirma testing.

Limited post procedural scanning was negative for hematoma or
additional complication. Dressings were placed. The patient
tolerated the above procedures procedure well without immediate
postprocedural complication.
FINDINGS: Nodule reference number based on prior diagnostic ultrasound: 2

Maximum size: 1.9 cm

Location: Right; Inferior

ACR TI-RADS risk category: TR4 (4-6 points)

Reason for biopsy: meets ACR TI-RADS criteria

_________________________________________________________

Nodule reference number based on prior diagnostic ultrasound: 3

Maximum size: 4.2cm

Location: Left; Superior

ACR TI-RADS risk category: TR3 (3 points)

Reason for biopsy: meets ACR TI-RADS criteria

Ultrasound imaging confirms appropriate placement of the needles
within the thyroid nodule.
IMPRESSION: 1. Technically successful ultrasound guided fine needle aspiration
of right inferior lobe thyroid nodule
2. Technically successful ultrasound guided fine needle aspiration
of left superior lobe thyroid nodule.

Performed and read by Bogdanov, Bekpolat

## 2022-04-07 IMAGING — US US FNA BIOPSY THYROID 1ST LESION
1 series · 13 of 25 positions shown · non-contrast
Comparison: US Thyroid 04/14/20

MEDICATIONS:
None

COMPLICATIONS:
None immediate.

INDICATION: Indeterminate thyroid nodules

EXAM:
ULTRASOUND GUIDED FINE NEEDLE ASPIRATION OF INDETERMINATE THYROID
NODULES
TECHNIQUE: Informed written consent was obtained from the patient after a
discussion of the risks, benefits and alternatives to treatment.
Questions regarding the procedure were encouraged and answered. A
timeout was performed prior to the initiation of the procedure.

[Series 1: us fna biopsy thyroid 1st lesion · 0.06mm/px · 26 acquisitions, 13 frames shown]
[im 1/26]
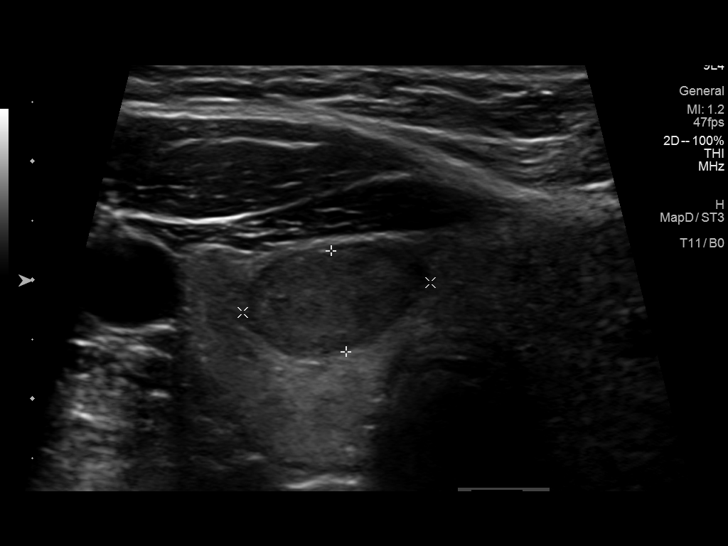
[im 3/26]
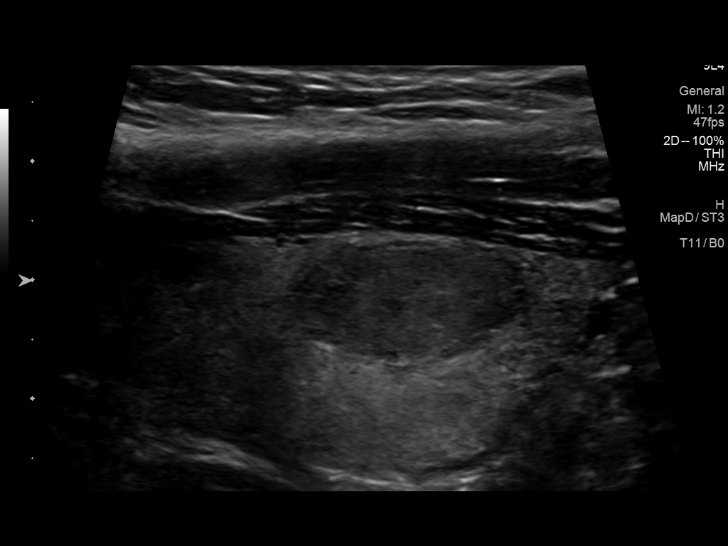
[im 5/26]
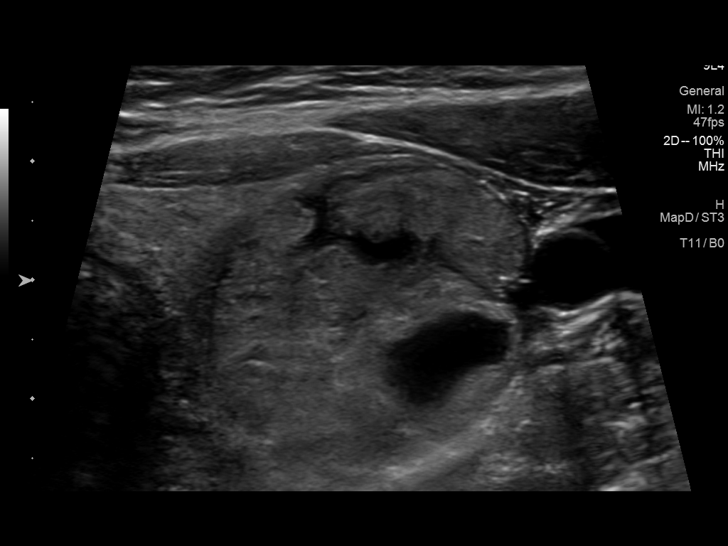
[im 7/26]
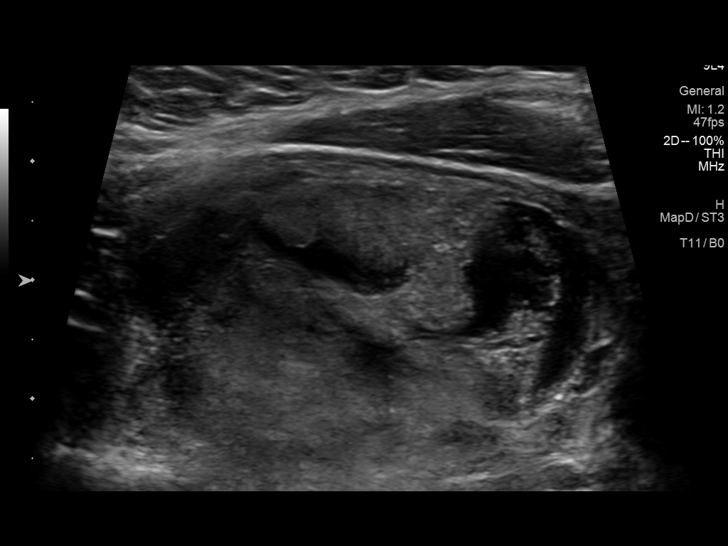
[im 9/26]
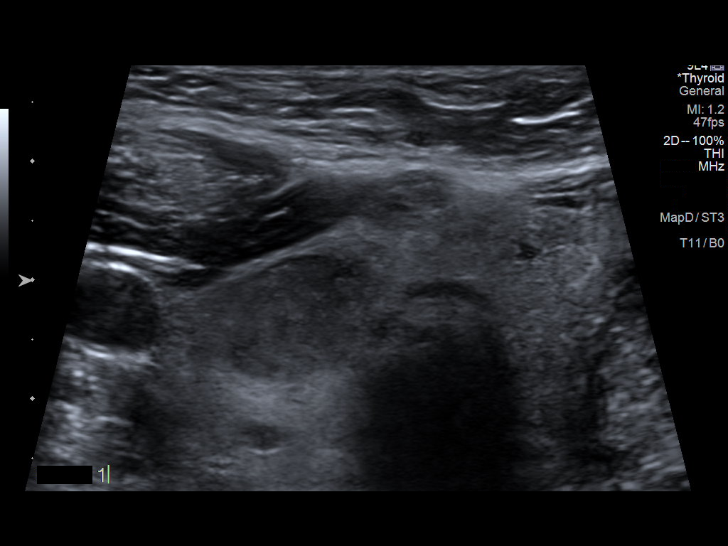
[im 11/26]
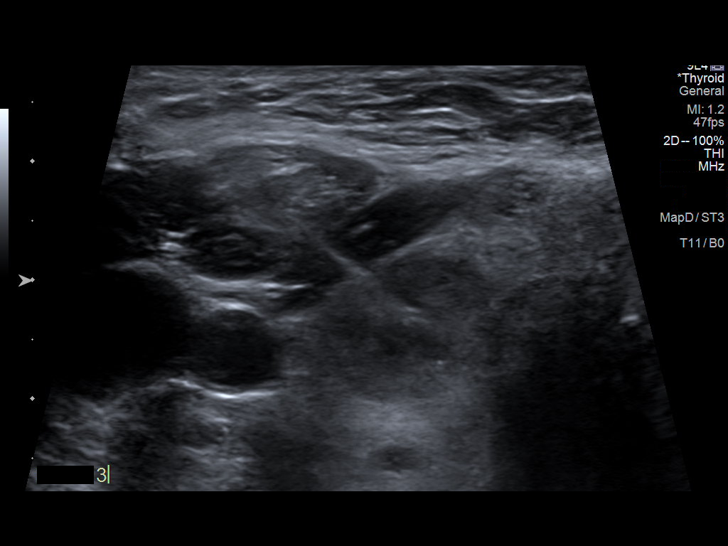
[im 13/26]
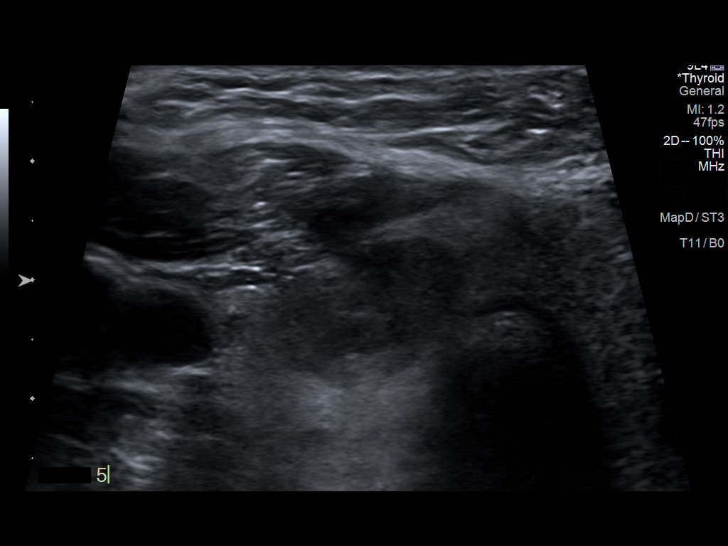
[im 15/26]
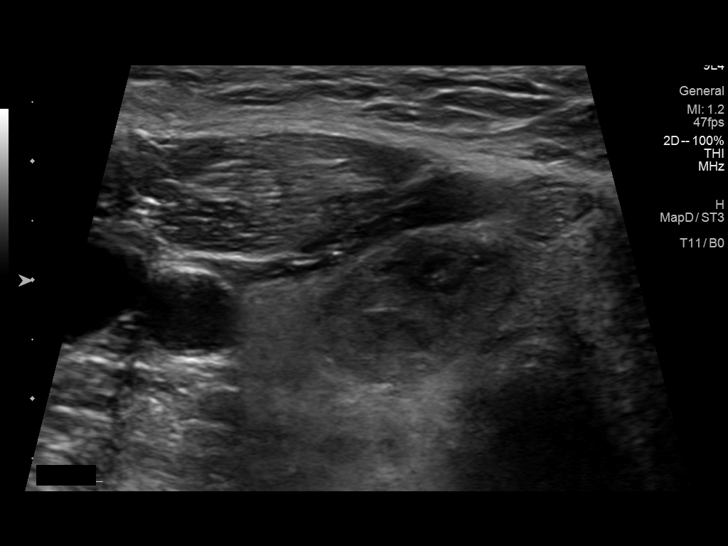
[im 17/26]
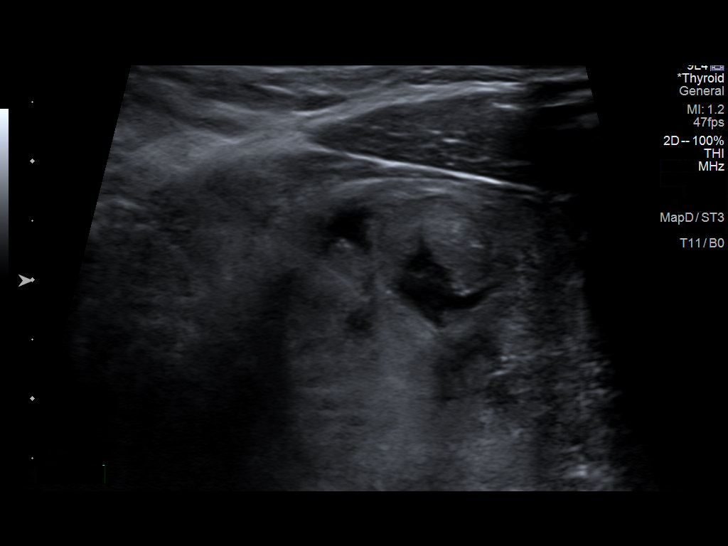
[im 19/26]
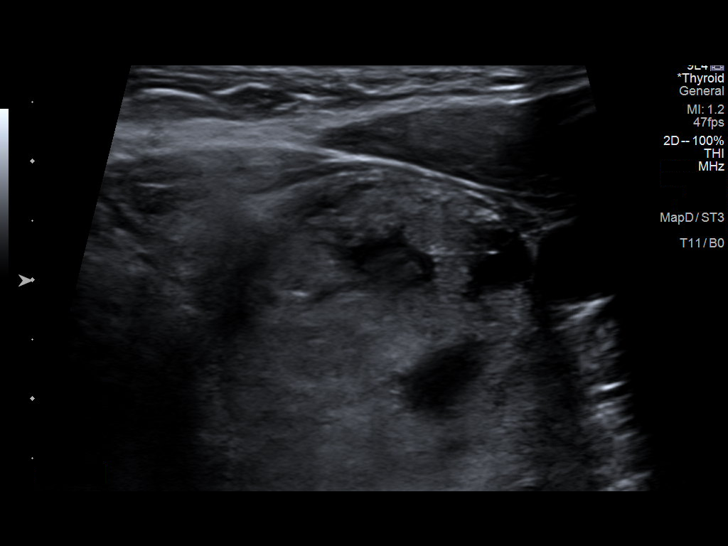
[im 21/26]
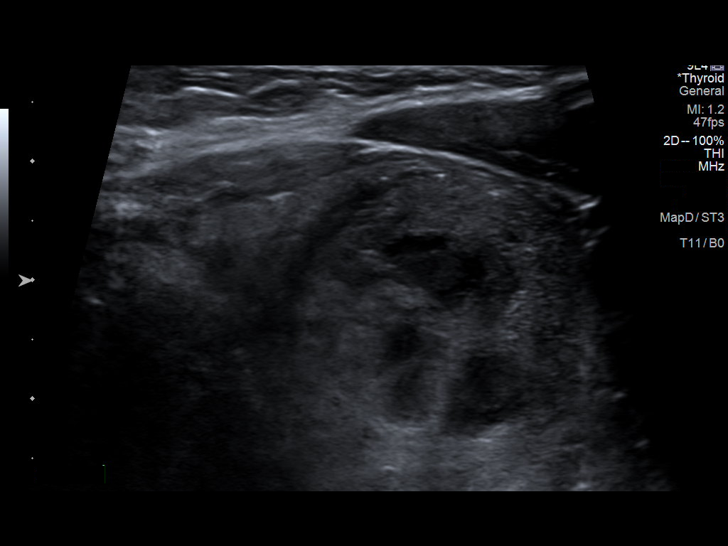
[im 23/26]
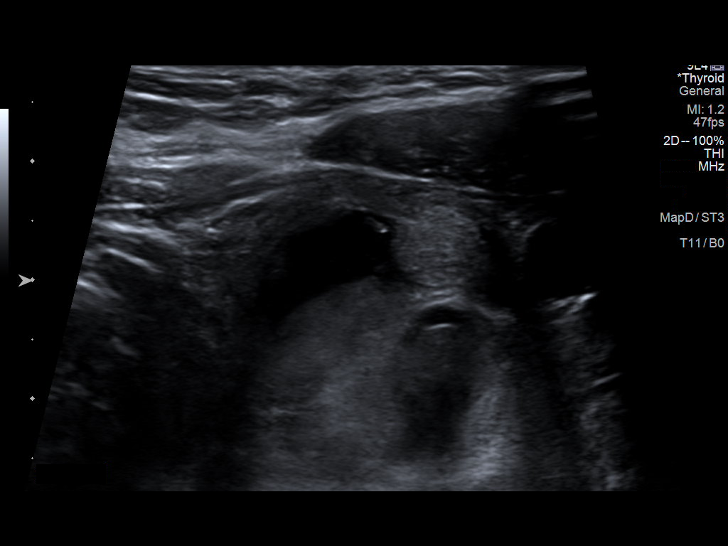
[im 26/26]
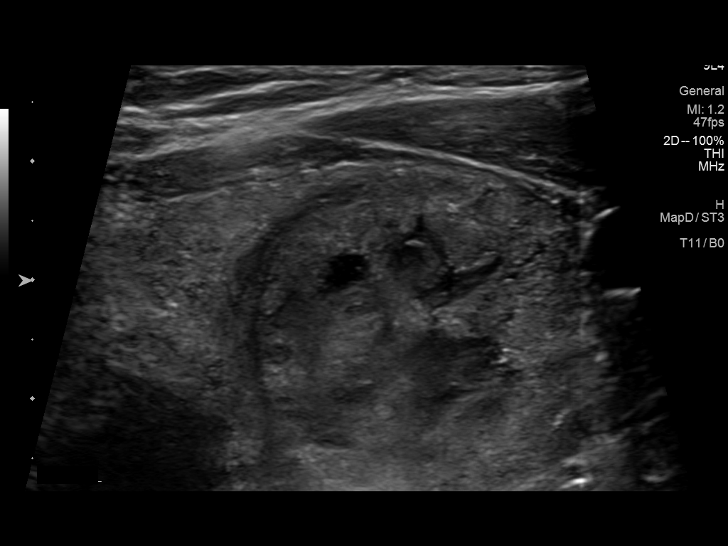

[13 of 25 positions shown; findings below may reference images not displayed]

Pre-procedural ultrasound scanning demonstrated unchanged size and
appearance of the indeterminate nodules within the right and left
lobes

The procedure was planned. The neck was prepped in the usual sterile
fashion, and a sterile drape was applied covering the operative
field. A timeout was performed prior to the initiation of the
procedure. Local anesthesia was provided with 1% lidocaine.

Under direct ultrasound guidance, 5 FNA biopsies were performed of
the right inferior lobe nodule with a 27 gauge needle. Multiple
ultrasound images were saved for procedural documentation purposes.
The samples were prepared and submitted to pathology. Two of these
specimens were reserved for Afirma testing.

Under direct ultrasound guidance, 5 FNA biopsies were performed of
the left superior lobe nodule with a 27 gauge needle. Multiple
ultrasound images were saved for procedural documentation purposes.
The samples were prepared and submitted to pathology. Two of these
specimens were reserved for Afirma testing.

Limited post procedural scanning was negative for hematoma or
additional complication. Dressings were placed. The patient
tolerated the above procedures procedure well without immediate
postprocedural complication.
FINDINGS: Nodule reference number based on prior diagnostic ultrasound: 2

Maximum size: 1.9 cm

Location: Right; Inferior

ACR TI-RADS risk category: TR4 (4-6 points)

Reason for biopsy: meets ACR TI-RADS criteria

_________________________________________________________

Nodule reference number based on prior diagnostic ultrasound: 3

Maximum size: 4.2cm

Location: Left; Superior

ACR TI-RADS risk category: TR3 (3 points)

Reason for biopsy: meets ACR TI-RADS criteria

Ultrasound imaging confirms appropriate placement of the needles
within the thyroid nodule.
IMPRESSION: 1. Technically successful ultrasound guided fine needle aspiration
of right inferior lobe thyroid nodule
2. Technically successful ultrasound guided fine needle aspiration
of left superior lobe thyroid nodule.

Performed and read by Bogdanov, Bekpolat

## 2023-06-03 ENCOUNTER — Ambulatory Visit (INDEPENDENT_AMBULATORY_CARE_PROVIDER_SITE_OTHER): Payer: Managed Care, Other (non HMO)

## 2023-06-08 ENCOUNTER — Telehealth (INDEPENDENT_AMBULATORY_CARE_PROVIDER_SITE_OTHER): Payer: Self-pay | Admitting: Otolaryngology

## 2023-06-08 NOTE — Telephone Encounter (Signed)
 LVM to confirm appt & location [phone] afm

## 2023-06-09 ENCOUNTER — Encounter (INDEPENDENT_AMBULATORY_CARE_PROVIDER_SITE_OTHER): Payer: Self-pay

## 2023-06-09 ENCOUNTER — Ambulatory Visit (INDEPENDENT_AMBULATORY_CARE_PROVIDER_SITE_OTHER): Admitting: Otolaryngology

## 2023-06-09 VITALS — BP 154/93 | HR 82 | Ht 62.0 in | Wt 258.0 lb

## 2023-06-09 DIAGNOSIS — D440 Neoplasm of uncertain behavior of thyroid gland: Secondary | ICD-10-CM | POA: Diagnosis not present

## 2023-06-09 DIAGNOSIS — E042 Nontoxic multinodular goiter: Secondary | ICD-10-CM | POA: Diagnosis not present

## 2023-06-09 NOTE — Progress Notes (Signed)
 Patient ID: Elizabeth Donaldson, female   DOB: [birth year], 43 y.o.   MRN: [id number]  Follow-up: Multinodular thyroid goiter  HPI: The patient is a 43 year old female who returns today for her follow-up evaluation.  The patient was last seen 1 year ago.  At that time, she was noted to have a multinodular thyroid goiter.  Her previous thyroid ultrasound showed 3 nodules that were greater than 1 cm.  The largest nodule measured 4.2 cm on the left side.  Another 1.9 cm nodule on the right side met the criteria for fine needle aspiration biopsy.  She also has a 1.1 cm isthmus nodule that met the criteria for observation.  The FNA of her right and left nodules were all consistent with benign follicular nodules.  The patient returns today reporting no new difficulty.  She denies any dysphagia, odynophagia, or dyspnea.  She has no other complaint.  Exam: General: Communicates without difficulty, well nourished, no acute distress. Head: Normocephalic, no evidence injury, no tenderness, facial buttresses intact without stepoff. Face/sinus: No tenderness to palpation and percussion. Facial movement is normal and symmetric. Eyes: PERRL, EOMI. No scleral icterus, conjunctivae clear. Neuro: CN II exam reveals vision grossly intact.  No nystagmus at any point of gaze. Ears: Auricles well formed without lesions.  Ear canals are intact without mass or lesion.  No erythema or edema is appreciated.  The TMs are intact without fluid. Nose: External evaluation reveals normal support and skin without lesions.  Dorsum is intact.  Anterior rhinoscopy reveals pink mucosa over anterior aspect of inferior turbinates and intact septum.  No purulence noted. Oral:  Oral cavity and oropharynx are intact, symmetric, without erythema or edema.  Mucosa is moist without lesions. Neck: Full range of motion without pain.  There is no significant lymphadenopathy.  No masses palpable.  Thyroid bed is enlarged. Parotid glands and submandibular glands  equal bilaterally without mass.  Trachea is midline. Neuro:  CN 2-12 grossly intact. Gait normal.   Assessment:  1.  Multinodular thyroid goiter.  Two of her largest nodules were biopsied, and was consistent with benign follicular nodules. 2.  The patient is currently asymptomatic.  She has no dysphagia, odynophagia or dysphonia.   Plan: 1.  The physical exam findings are reviewed with the patient. 2.  Since the patient is currently asymptomatic, we will proceed with conservative observation. 3.  The patient will return for reevaluation in 1 year.

## 2024-06-11 ENCOUNTER — Ambulatory Visit (INDEPENDENT_AMBULATORY_CARE_PROVIDER_SITE_OTHER): Admitting: Otolaryngology
